# Patient Record
Sex: Male | Born: 1940 | Race: White | Hispanic: No | Marital: Married | Smoking: Former smoker
Health system: Southern US, Community
[De-identification: ages and names within clinical notes are randomized; demographics above are authoritative.]

## PROBLEM LIST (undated history)

## (undated) DIAGNOSIS — I10 Essential (primary) hypertension: Secondary | ICD-10-CM

## (undated) DIAGNOSIS — J449 Chronic obstructive pulmonary disease, unspecified: Secondary | ICD-10-CM

## (undated) DIAGNOSIS — C801 Malignant (primary) neoplasm, unspecified: Secondary | ICD-10-CM

## (undated) DIAGNOSIS — I509 Heart failure, unspecified: Secondary | ICD-10-CM

## (undated) DIAGNOSIS — I714 Abdominal aortic aneurysm, without rupture, unspecified: Secondary | ICD-10-CM

## (undated) DIAGNOSIS — C3490 Malignant neoplasm of unspecified part of unspecified bronchus or lung: Secondary | ICD-10-CM

## (undated) DIAGNOSIS — K219 Gastro-esophageal reflux disease without esophagitis: Secondary | ICD-10-CM

## (undated) DIAGNOSIS — Z951 Presence of aortocoronary bypass graft: Secondary | ICD-10-CM

## (undated) HISTORY — PX: CORONARY ARTERY BYPASS GRAFT: SHX141

## (undated) HISTORY — PX: CARDIAC DEFIBRILLATOR PLACEMENT: SHX171

---

## 2003-11-12 ENCOUNTER — Emergency Department: Payer: Self-pay | Admitting: Emergency Medicine

## 2005-03-01 ENCOUNTER — Other Ambulatory Visit: Payer: Self-pay

## 2005-03-02 ENCOUNTER — Inpatient Hospital Stay: Payer: Self-pay | Admitting: Internal Medicine

## 2005-03-03 ENCOUNTER — Other Ambulatory Visit: Payer: Self-pay

## 2005-03-04 ENCOUNTER — Other Ambulatory Visit: Payer: Self-pay

## 2008-07-16 ENCOUNTER — Encounter: Payer: Self-pay | Admitting: Internal Medicine

## 2008-07-16 ENCOUNTER — Inpatient Hospital Stay (HOSPITAL_COMMUNITY): Admission: EM | Admit: 2008-07-16 | Discharge: 2008-07-18 | Payer: Self-pay | Admitting: Internal Medicine

## 2008-07-16 ENCOUNTER — Ambulatory Visit: Payer: Self-pay | Admitting: Internal Medicine

## 2008-07-16 ENCOUNTER — Encounter: Payer: Self-pay | Admitting: Emergency Medicine

## 2008-07-17 ENCOUNTER — Encounter: Payer: Self-pay | Admitting: Cardiovascular Disease

## 2008-11-10 ENCOUNTER — Emergency Department: Payer: Self-pay | Admitting: Emergency Medicine

## 2010-05-17 LAB — BASIC METABOLIC PANEL
CO2: 26 mEq/L (ref 19–32)
CO2: 28 mEq/L (ref 19–32)
Calcium: 8.6 mg/dL (ref 8.4–10.5)
Chloride: 104 mEq/L (ref 96–112)
GFR calc Af Amer: 60 mL/min — ABNORMAL LOW (ref >60)
GFR calc Af Amer: >60 mL/min (ref >60)
GFR calc non Af Amer: 49 mL/min — ABNORMAL LOW (ref >60)
Glucose, Bld: 89 mg/dL (ref 70–99)
Sodium: 137 mEq/L (ref 135–145)
Sodium: 140 mEq/L (ref 135–145)

## 2010-05-17 LAB — CBC
HCT: 37.8 % — ABNORMAL LOW (ref 39.0–52.0)
Hemoglobin: 13.4 g/dL (ref 13.0–17.0)
MCHC: 33.8 g/dL (ref 30.0–36.0)
MCHC: 36.1 g/dL — ABNORMAL HIGH (ref 30.0–36.0)
MCV: 94.7 fL (ref 78.0–100.0)
MCV: 95.4 fL (ref 78.0–100.0)
Platelets: 103 10*3/uL — ABNORMAL LOW (ref 150–400)
Platelets: 108 10*3/uL — ABNORMAL LOW (ref 150–400)
RBC: 3.91 MIL/uL — ABNORMAL LOW (ref 4.22–5.81)
RBC: 4.2 MIL/uL — ABNORMAL LOW (ref 4.22–5.81)
RDW: 13.5 % (ref 11.5–15.5)
RDW: 13.6 % (ref 11.5–15.5)
WBC: 7.1 10*3/uL (ref 4.0–10.5)
WBC: 7.6 10*3/uL (ref 4.0–10.5)
WBC: 8 10*3/uL (ref 4.0–10.5)

## 2010-05-17 LAB — DIFFERENTIAL
Eosinophils Absolute: 0 10*3/uL (ref 0.0–0.7)
Eosinophils Relative: 1 % (ref 0–5)
Lymphs Abs: 2.7 10*3/uL (ref 0.7–4.0)

## 2010-05-17 LAB — POCT CARDIAC MARKERS
CKMB, poc: <1 ng/mL — ABNORMAL LOW (ref 1.0–8.0)
Myoglobin, poc: 97.4 ng/mL (ref 12–200)

## 2010-05-17 LAB — CARDIAC PANEL(CRET KIN+CKTOT+MB+TROPI)
CK, MB: 42.9 ng/mL — ABNORMAL HIGH (ref 0.3–4.0)
Relative Index: 12.1 — ABNORMAL HIGH (ref 0.0–2.5)
Relative Index: 9.7 — ABNORMAL HIGH (ref 0.0–2.5)
Total CK: 441 U/L — ABNORMAL HIGH (ref 7–232)
Troponin I: 1.95 ng/mL (ref 0.00–0.06)
Troponin I: 37.33 ng/mL (ref 0.00–0.06)
Troponin I: 40.93 ng/mL (ref 0.00–0.06)

## 2010-05-17 LAB — COMPREHENSIVE METABOLIC PANEL
ALT: 11 U/L (ref 0–53)
AST: 17 U/L (ref 0–37)
CO2: 26 mEq/L (ref 19–32)
Calcium: 9 mg/dL (ref 8.4–10.5)
Chloride: 103 mEq/L (ref 96–112)
GFR calc Af Amer: 51 mL/min — ABNORMAL LOW (ref >60)
GFR calc non Af Amer: 42 mL/min — ABNORMAL LOW (ref >60)
Potassium: 3.6 mEq/L (ref 3.5–5.1)
Sodium: 138 mEq/L (ref 135–145)

## 2010-05-17 LAB — HEMOGLOBIN A1C
Hgb A1c MFr Bld: 5.7 % (ref 4.6–6.1)
Mean Plasma Glucose: 117 mg/dL

## 2010-05-17 LAB — LIPID PANEL
HDL: 27 mg/dL — ABNORMAL LOW (ref >39)
Total CHOL/HDL Ratio: 4.7 RATIO
Triglycerides: 101 mg/dL (ref <150)

## 2010-05-17 LAB — GLUCOSE, CAPILLARY: Glucose-Capillary: 168 mg/dL — ABNORMAL HIGH (ref 70–99)

## 2010-05-17 LAB — PROTIME-INR: Prothrombin Time: 14 seconds (ref 11.6–15.2)

## 2010-06-22 NOTE — Discharge Summary (Signed)
[name], [name]                 ACCOUNT NO.:  [id number]   MEDICAL RECORD NO.:  [id number]          PATIENT TYPE:  INP   LOCATION:  2926                         FACILITY:  [facility]   PHYSICIAN:  Verne Carrow, MDDATE OF BIRTH:  09-01-1940   DATE OF ADMISSION:  07/16/2008  DATE OF DISCHARGE:  07/18/2008                               DISCHARGE SUMMARY   DISCHARGING DIAGNOSES:  1. Coronary artery disease/non-ST-elevated myocardial infarction      status post cardiac catheterization this admission.  The patient      with double-vessel coronary artery disease, global left ventricular      dysfunction with segmental wall motion abnormality, underwent      successful percutaneous coronary intervention with balloon      angioplasty only of distal obtuse marginal 1 lesion, will need      aspirin indefinitely and Plavix for at least 30 days but would like      to keep the patient on Plavix for a year if tolerated.  2. Ongoing tobacco abuse.  3. History of coronary artery disease status post coronary artery      bypass graft at [facility] approximately 15 years ago.  4. Hypertension.  5. Hyperlipidemia.  6. Chronic renal insufficiency.  7. Abnormal chest x-ray this admission.  The patient with 9-mm right      lower lobe pulmonary nodule, recommend short-term followup chest x-      ray in 2-3 months, no comparison studies to compare available.   HOSPITAL COURSE:  [name] is a 70 year old Caucasian gentleman with  known history of coronary artery disease status post CABG 15 years ago.  [name] receives his care through the [facility].  He  presented this admission complaining of chest discomfort with elevated  cardiac markers and hypertensive urgency.  The patient anticoagulated.  EKG showed T-wave inversions in inferior leads and flattening in lateral  leads.  Loaded with Plavix and continued nitroglycerin drip.  Proceed  with cardiac catheterization on July 17, 2008.  Chest  x-ray obtained  showed 9-mm right lower lobe pulmonary nodule, recommend followup x-ray  in 2-3 months.  The patient tolerated cardiac catheterization  intervention without complications.  A 2-D echocardiogram obtained,  technically difficult study, inferolateral and posterior basal  hypokinesis, mild mitral regurgitation.  Tobacco cessation education,  cardiac rehabilitation consults obtained.  The patient is ambulating in  the hall without difficulty, stable to be discharged home.  The patient  wishes to follow up with the [facility]  I have  given him copies of his cardiac catheterization and chest x-ray results  to follow up at the [place] as he is unable to recall the name of the doctor  that he sees.  He has been given the post cardiac catheterization  discharge instructions.  He agrees to call the [place] for followup  appointment in 1-2 weeks.  He also has our office number in case he has  any problems and needs to see one of [place].   MEDICATIONS AT THE TIME OF DISCHARGE:  1. Nitroglycerin as needed.  2. Atenolol 25  mg daily.  3. His omeprazole has been stopped.  4. He is instructed to use ranitidine, over the counter, instead.  5. Niacin 1000 mg daily.  6. Simvastatin 40.  7. Lisinopril 20.  8. Citalopram 40.  9. Isosorbide 120.  10.Digoxin 0.25.  11.Amlodipine 10 mg.  12.Plavix 75.  13.Aspirin 325 daily.  He has prescriptions for the Plavix, amlodipine which is new, and the  nitroglycerin.   DURATION OF DISCHARGE ENCOUNTER:  Well over 30 minutes.      Dorian Pod, ACNP      Verne Carrow, MD  Electronically Signed    MB/MEDQ  D:  07/18/2008  T:  07/19/2008  Job:  [id number]

## 2010-06-22 NOTE — H&P (Signed)
[name], [name]                 ACCOUNT NO.:  [id number]   MEDICAL RECORD NO.:  [id number]          PATIENT TYPE:  INP   LOCATION:  2908                         FACILITY:  [facility]   PHYSICIAN:  Hillis Range, MD       DATE OF BIRTH:  [birth year]   DATE OF ADMISSION:  07/16/2008  DATE OF DISCHARGE:                              HISTORY & PHYSICAL   CHIEF COMPLAINT:  Chest pain.   HISTORY OF PRESENT ILLNESS:  Mr. Grinder is a pleasant 70 year old  gentleman with a history of coronary artery disease status post CABG at  [facility] approximately 15 years ago, who now  presents on transfer from [facility] with chest pain.  The  patient reports being in his usual state of health until approximately 2  p.m. this afternoon, when he developed acute onset of chest discomfort  with radiation into both arms.  He reports associated nausea without  emesis.  He denies shortness of breath, palpitations, presyncope, or  syncope.  Upon arrival to [facility], the patient was noted to  have a blood pressure of 210/110.  He was initiated on nitroglycerin  drip, but continued to have chest discomfort.  He is therefore  transferred to [facility] for urgent management.  Presently, he  reports that his chest pain is 2/10, (previously 7/10).  He is also  noted to have frequent premature ventricular contractions.  The patient  is unable to describe his medicine regimen, but reports compliance with  medications.  He is a very active gentleman without any recent episodes  of angina or congestive heart failure.  He continues to smoke  approximately one pack per day.   PAST MEDICAL HISTORY:  1. Coronary artery disease status post 3-vessel CABG at [facility]      approximately 15 years ago.  2. Hypertension.  3. Hyperlipidemia.  4. Chronic renal insufficiency.   MEDICATIONS:  The patient is unable to recall.   ALLERGIES:  No known drug allergies.   REVIEW OF SYSTEMS:   All systems were reviewed and negative except as  outlined in the HPI above and as documented below.  The patient reports  occasional decrease in appetite.   SOCIAL HISTORY:  [place]  He  smokes 1 pack per day.  He denies alcohol or drug use.   FAMILY HISTORY:  Notable for coronary artery disease.   PHYSICAL EXAMINATION:  VITAL SIGNS:  Telemetry reveals sinus rhythm with  frequent premature ventricular contractions.  Blood pressure 178/116,  heart rate 63, respirations 16, sats 98% on 2 L, afebrile.  GENERAL:  The patient is a thin, well-appearing gentleman in no acute  distress.  He is alert and oriented x3.  HEENT: Normocephalic, atraumatic.  Sclerae clear.  Conjunctivae pink.  Oropharynx clear.  NECK:  Supple.  No thyromegaly, JVD, or bruits.  LUNGS:  Clear to auscultation bilaterally.  HEART:  Regular rate and rhythm with a very crisp S2, 2/6 systolic  ejection murmur along the left sternal border.  GI:  Soft, nontender, nondistended.  Positive bowel sounds.  EXTREMITIES:  No clubbing, cyanosis, or edema.  NEUROLOGIC:  Cranial nerves II-XII are intact.  Strength and sensation  are intact.  SKIN:  No ecchymoses or lacerations.  MUSCULOSKELETAL:  No deformity or atrophy.  PSYCH:  Euthymic mood, full affect.   EKG reveals sinus rhythm at 65 beats per minute with an inferior infarct  pattern as well as frequent premature ventricular contractions and ST  depression in the lateral precordial leads.   Labs from [facility] reveal a creatinine of 1.6, glucose 101,  potassium 3.6, INR 1.1.  White blood cell count 7.1, hematocrit 36,  platelets 108.  CK-MB less than 1, troponin less than 0.05.   Chest x-ray is pending.   IMPRESSION:  Mr. Cafiero is a pleasant 70 year old gentleman with coronary  artery disease, who is admitted on transfer from [facility] for  further evaluation and management of acute coronary syndrome and   hypertensive urgency.  The patient's chest pain appears to be improving  with a nitroglycerin drip.  He has EKG changes worrisome for ischemia,  but without ST elevation.  He has mild renal insufficiency and a prior  bypass graft.   PLAN:  At this point, I think that aggressive medical management is  necessary.  I have initiated the patient on Plavix, intravenous heparin,  and intravenous nitroglycerin.  We will attempt to control his blood  pressure and hopefully that will improve his angina.  If he continues to  have worsening chest pain overnight, then he will require urgent  catheterization.  Otherwise, we will plan to proceed with left heart  catheterization tomorrow morning after he has been adequately hydrated.  We will try to obtain records from [facility]  regarding his bypass anatomy.      Hillis Range, MD  Electronically Signed     JA/MEDQ  D:  07/16/2008  T:  07/17/2008  Job:  [id number]

## 2010-06-22 NOTE — Discharge Summary (Signed)
[name], [name]                 ACCOUNT NO.:  [id number]   MEDICAL RECORD NO.:  [id number]          PATIENT TYPE:  INP   LOCATION:  2926                         FACILITY[facility]  [facility]   PHYSICIAN:  Verne Carrow, MDDATE OF BIRTH:  11/10/1940   DATE OF ADMISSION:  07/16/2008  DATE OF DISCHARGE:  07/18/2008                               DISCHARGE SUMMARY   DISCHARGING DIAGNOSES:  1. Coronary artery disease/non-ST-elevated myocardial infarction.  2.   INCOMPLETE DICTATION.      Dorian Pod, ACNP      Verne Carrow, MD  Electronically Signed    MB/MEDQ  D:  07/18/2008  T:  07/18/2008  Job:  [id number]

## 2010-06-22 NOTE — Cardiovascular Report (Signed)
[name], [name]                 ACCOUNT NO.:  [id number]   MEDICAL RECORD NO.:  [id number]          PATIENT TYPE:  INP   LOCATION:  2926                         [facility]ACILITY[facility]  [facility]   PHYSICIAN:  [name], MDDATE OF BIRTH:  1940/05/17   DATE OF PROCEDURE:  07/17/2008  DATE OF DISCHARGE:                            CARDIAC CATHETERIZATION   PROCEDURES PERFORMED:  1. Left heart catheterization.  2. Selective coronary angiography.  3. Saphenous vein graft angiography.  4. Left ventricular angiogram.  5. Percutaneous coronary intervention with balloon angioplasty only of      a near-total occlusion of the distal segment of the first obtuse      marginal coronary artery.   OPERATOR:  Jared Carrow, MD   INDICATIONS:  Non-ST-elevation myocardial infarction in a 70 year old  patient with known coronary artery disease, who underwent three-vessel  CABG at [facility] in 1995, who has a history of  ongoing tobacco abuse, chronic renal insufficiency, hypertension, and  hyperlipidemia, and presented to [facility] yesterday with  complaints of ongoing chest pain.  The patient ruled in for a myocardial  infarction overnight and has had continued mild chest discomfort for the  last 72 hours.  He was treated with intravenous heparin, intravenous  nitroglycerin last night, and was loaded with Plavix last night as well.   PROCEDURE IN DETAIL:  The patient was brought to the main cardiac  catheterization laboratory after signing informed consent for the  procedure.  The right groin was prepped and draped in sterile fashion.  Lidocaine 1% was used for local anesthesia.  A 6-French sheath was  inserted into the right femoral artery without difficulty.  Coronary  angiography was performed.  A JL5 diagnostic catheter was used to  selectively engage the left main coronary artery.  A JR4 catheter was  used to selectively engage the native right coronary  artery as well as  the left internal mammary artery and the saphenous vein graft to the  PDA.  An LCB catheter was used to selectively engage the saphenous vein  graft to the diagonal and the obtuse marginal.  A pigtail catheter was  used to perform a left ventricular angiogram.   At this point in the case, we elected to proceed to intervention of the  near-total occlusion in the distal segment of the first obtuse marginal  coronary artery.  As noted above, the patient had been loaded with 600  mg of Plavix yesterday.  He was given a bolus of Angiomax and a drip was  started.  A Cougar intracoronary wire was passed through an XB 4.0  guiding catheter when the ACT was greater than 200.  A 2.0 x 8 mm  balloon was inflated twice for long inflations in the area of stenosis  in the distal vessel.  The balloon was taken to a maximum of 12  atmospheres, which gave [place] 2.19 mm.  I do not feel that the vessel was  large enough for a stent placement.  There was an optimal angiographic  result.  The stenosis prior to the intervention was  99% with TIMI 2  flow.  Following the intervention, the stenosis was 5% with TIMI 3 flow.  The patient tolerated the procedure well and was taken to the holding  area in stable condition.   HEMODYNAMIC FINDINGS:  Central aortic pressure 110/58.  Left ventricular  pressure 126/1.  Left ventricular end-diastolic pressure 11.   ANGIOGRAPHIC DATA:  1. The left main coronary artery had no evidence of obstructive      disease.  2. The left anterior descending is a large vessel that coursed to the      apex and just has minor disease.  There are serial 30% lesions in      the proximal midportion of the LAD.  First diagonal branch appears      to have a 40% ostial stenosis.  3. The circumflex artery is a large-caliber vessel that is comprised      mostly of a large obtuse marginal branch.  The first obtuse      marginal branch is a large-sized vessel that has a 99%  hazy-      appearing stenosis in the very distal portion where the vessel is      approximately 2.0 mm.  There is possible thrombus in this location.      The second obtuse marginal is a small to moderate-sized vessel that      fills late from left-to-left collaterals.  The AV groove circumflex      is a small to moderate-sized vessel.  4. The right coronary artery has a 100% proximal occlusion.  The      distal right coronary artery and PDA fills from left-to-right      collaterals.  This has been known since his last catheterization in      2007.  5. The saphenous vein graft to the second obtuse marginal is occluded.  6. The saphenous vein graft to the diagonal is occluded.  7. The saphenous vein graft to the posterior descending artery is      occluded.  8. Left internal mammary artery was selectively injected that does not      appear to have then used for bypass.  9. Left ventricular angiogram was performed in the RAO projection that      shows global hypokinesis with inferior wall akinesis.  Overall,      ejection fraction is 35-40%.  There is no evidence of mitral      regurgitation.   IMPRESSION:  1. Double-vessel coronary artery disease.  2. Global left ventricular systolic dysfunction with segmental wall      motion abnormalities.  3. Successful percutaneous coronary intervention with balloon      angioplasty only of the distal portion of the first obtuse marginal      branch.   RECOMMENDATIONS:  The patient should be continued on aspirin and Plavix  for at least 1 month the longer if he tolerates this medical therapy.  We will check an echocardiogram here in the hospital today to get  another assessment of his ejection fraction and look at his wall motion.  He will need to be started on an ACE inhibitor prior to discharge given  his left ventricular dysfunction, however, I will not start that today  with his renal insufficiency and recent catheterization.  We will  follow  up on a metabolic profile in the morning.  The patient has been found to  have a right upper lobe nodule on x-ray and will need follow up chest  x-  ray in 2-3 months.  For now, we will continue his beta-blocker and  statin therapy.  He is also encouraged to stop smoking.      Jared Carrow, MD  Electronically Signed     CM/MEDQ  D:  07/17/2008  T:  07/17/2008  Job:  [id number]

## 2011-02-23 ENCOUNTER — Emergency Department: Payer: Self-pay | Admitting: Unknown Physician Specialty

## 2011-02-23 LAB — URINALYSIS, COMPLETE
Bilirubin,UR: NEGATIVE
Leukocyte Esterase: NEGATIVE
Nitrite: NEGATIVE
Ph: 5 (ref 4.5–8.0)
Squamous Epithelial: NONE SEEN

## 2013-02-27 ENCOUNTER — Emergency Department: Payer: Self-pay | Admitting: Emergency Medicine

## 2013-02-27 LAB — PROTIME-INR
INR: 1.2
PROTHROMBIN TIME: 15.3 s — AB (ref 11.5–14.7)

## 2013-02-27 LAB — COMPREHENSIVE METABOLIC PANEL
ALBUMIN: 3.9 g/dL (ref 3.4–5.0)
ANION GAP: 7 (ref 7–16)
Alkaline Phosphatase: 248 U/L — ABNORMAL HIGH
BILIRUBIN TOTAL: 1.2 mg/dL — AB (ref 0.2–1.0)
BUN: 15 mg/dL (ref 7–18)
CALCIUM: 8.5 mg/dL (ref 8.5–10.1)
CHLORIDE: 103 mmol/L (ref 98–107)
CO2: 29 mmol/L (ref 21–32)
Creatinine: 1.79 mg/dL — ABNORMAL HIGH (ref 0.60–1.30)
GFR CALC AF AMER: 43 — AB
GFR CALC NON AF AMER: 37 — AB
Glucose: 113 mg/dL — ABNORMAL HIGH (ref 65–99)
Osmolality: 279 (ref 275–301)
Potassium: 3.4 mmol/L — ABNORMAL LOW (ref 3.5–5.1)
SGOT(AST): 53 U/L — ABNORMAL HIGH (ref 15–37)
SGPT (ALT): 55 U/L (ref 12–78)
SODIUM: 139 mmol/L (ref 136–145)
Total Protein: 7.5 g/dL (ref 6.4–8.2)

## 2013-02-27 LAB — URINALYSIS, COMPLETE
Bacteria: NONE SEEN
Bilirubin,UR: NEGATIVE
GLUCOSE, UR: NEGATIVE mg/dL (ref 0–75)
KETONE: NEGATIVE
Leukocyte Esterase: NEGATIVE
Nitrite: NEGATIVE
PH: 5 (ref 4.5–8.0)
PROTEIN: NEGATIVE
RBC,UR: 17 /HPF (ref 0–5)
Specific Gravity: 1.021 (ref 1.003–1.030)
Squamous Epithelial: <1
WBC UR: 10 /HPF (ref 0–5)

## 2013-02-27 LAB — MAGNESIUM: Magnesium: 1.7 mg/dL — ABNORMAL LOW

## 2013-02-27 LAB — CBC WITH DIFFERENTIAL/PLATELET
BASOS PCT: 0.5 %
Basophil #: 0 10*3/uL (ref 0.0–0.1)
Eosinophil #: 0 10*3/uL (ref 0.0–0.7)
Eosinophil %: 0.4 %
HCT: 41.9 % (ref 40.0–52.0)
HGB: 14.1 g/dL (ref 13.0–18.0)
LYMPHS PCT: 16.2 %
Lymphocyte #: 1.1 10*3/uL (ref 1.0–3.6)
MCH: 31.5 pg (ref 26.0–34.0)
MCHC: 33.6 g/dL (ref 32.0–36.0)
MCV: 94 fL (ref 80–100)
MONOS PCT: 6.4 %
Monocyte #: 0.4 x10 3/mm (ref 0.2–1.0)
NEUTROS ABS: 5.1 10*3/uL (ref 1.4–6.5)
Neutrophil %: 76.5 %
PLATELETS: 114 10*3/uL — AB (ref 150–440)
RBC: 4.47 10*6/uL (ref 4.40–5.90)
RDW: 13.6 % (ref 11.5–14.5)
WBC: 6.7 10*3/uL (ref 3.8–10.6)

## 2013-02-27 LAB — RAPID INFLUENZA A&B ANTIGENS (ARMC ONLY)

## 2013-02-27 LAB — TROPONIN I

## 2013-02-27 LAB — PHOSPHORUS: PHOSPHORUS: 2.6 mg/dL (ref 2.5–4.9)

## 2013-02-28 LAB — URINE CULTURE

## 2017-11-10 ENCOUNTER — Ambulatory Visit
Admission: RE | Admit: 2017-11-10 | Discharge: 2017-11-10 | Disposition: A | Discharge status: Home / Self Care | Payer: No Typology Code available for payment source | Source: Ambulatory Visit | Attending: Radiation Oncology | Admitting: Radiation Oncology

## 2017-11-10 ENCOUNTER — Other Ambulatory Visit: Payer: Self-pay

## 2017-11-10 ENCOUNTER — Encounter (INDEPENDENT_AMBULATORY_CARE_PROVIDER_SITE_OTHER): Payer: Self-pay

## 2017-11-10 VITALS — BP 135/79 | HR 74 | Temp 97.1°F | Resp 16 | Ht 72.0 in

## 2017-11-10 DIAGNOSIS — C3431 Malignant neoplasm of lower lobe, right bronchus or lung: Secondary | ICD-10-CM | POA: Insufficient documentation

## 2017-11-10 DIAGNOSIS — Z79899 Other long term (current) drug therapy: Secondary | ICD-10-CM | POA: Insufficient documentation

## 2017-11-10 DIAGNOSIS — Z7901 Long term (current) use of anticoagulants: Secondary | ICD-10-CM | POA: Insufficient documentation

## 2017-11-10 NOTE — Consult Note (Signed)
NEW PATIENT EVALUATION  Name: [name]  MRN: [id number]  Date:   11/10/2017     DOB: 1940/03/04   This 77 y.o. 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  REFERRING PHYSICIAN: Burnell Blanks*  CHIEF COMPLAINT:  Chief Complaint  Patient presents with  . Lung Cancer    DIAGNOSIS: The encounter diagnosis was Malignant neoplasm of lower lobe of right lung ([place]).   PREVIOUS INVESTIGATIONS:  PET CT and CT scans requested for my review Pathology report reviewed Clinical notes reviewed  HPI: patient is a 76 year old male seen from the [place] hospital. He's been followed for an increasing mass in his right lower lobe which increased over time. Eventually in August 2019 had a CT-guided biopsy which was conclusive for adenocarcinoma. His PFTs have an FEV1 of 58% and DLCO of 46% even seen by thoracic oncology. The tumor on the most recent CT scan was measured at 1.1 x 1.8 cm. Patient and family have declined surgical intervention he did meet with radiation oncology who recommended SB RT although other facility cannot provide that and he is referred here for treatment. Patient is doing well. Has mild cough somethick phlegm in the morning no hemoptysis loss of weight or dysphagia.  PLANNED TREATMENT REGIMEN: SB RT  PAST MEDICAL HISTORY:  has no past medical history on file.    PAST SURGICAL HISTORY:   FAMILY HISTORY: family history is not on file.  SOCIAL HISTORY:    ALLERGIES: Dofetilide  MEDICATIONS:  Current Outpatient Medications  Medication Sig Dispense Refill  . atorvastatin (LIPITOR) 40 MG tablet Take 0.5 tablets by mouth daily.    . finasteride (PROSCAR) 5 MG tablet Take 1 tablet by mouth daily.    . metoprolol succinate (TOPROL-XL) 25 MG 24 hr tablet Take 0.5 tablets by mouth daily.    Marland Kitchen mexiletine (MEXITIL) 150 MG capsule Take 1 capsule by mouth daily.    . midodrine (PROAMATINE) 2.5 MG tablet Take 1  tablet by mouth 3 (three) times daily.    Marland Kitchen omeprazole (PRILOSEC) 20 MG capsule Take 1 capsule by mouth daily.    . Rivaroxaban (XARELTO) 15 MG TABS tablet Take 15 mg by mouth 2 (two) times daily with a meal.    . tamsulosin (FLOMAX) 0.4 MG CAPS capsule Take 1 capsule by mouth daily.    . Tiotropium Bromide-Olodaterol (STIOLTO RESPIMAT) 2.5-2.5 MCG/ACT AERS Inhale 2 puffs into the lungs daily.     No current facility-administered medications for this encounter.     ECOG PERFORMANCE STATUS:  0 - Asymptomatic  REVIEW OF SYSTEMS:  Patient denies any weight loss, fatigue, weakness, fever, chills or night sweats. Patient denies any loss of vision, blurred vision. Patient denies any ringing  of the ears or hearing loss. No irregular heartbeat. Patient denies heart murmur or history of fainting. Patient denies any chest pain or pain radiating to her upper extremities. Patient denies any shortness of breath, difficulty breathing at night, cough or hemoptysis. Patient denies any swelling in the lower legs. Patient denies any nausea vomiting, vomiting of blood, or coffee ground material in the vomitus. Patient denies any stomach pain. Patient states has had normal bowel movements no significant constipation or diarrhea. Patient denies any dysuria, hematuria or significant nocturia. Patient denies any problems walking, swelling in the joints or loss of balance. Patient denies any skin changes, loss of hair or loss of weight. Patient denies any excessive worrying or  anxiety or significant depression. Patient denies any problems with insomnia. Patient denies excessive thirst, polyuria, polydipsia. Patient denies any swollen glands, patient denies easy bruising or easy bleeding. Patient denies any recent infections, allergies or URI. Patient "s visual fields have not changed significantly in recent time.    PHYSICAL EXAM: BP 135/79 Comment: recheck  Pulse 74   Temp (!) 97.1 F (36.2 C) (Tympanic)   Resp 16    Ht 6' (1.829 m)  Well-developed well-nourished patient in NAD. HEENT reveals PERLA, EOMI, discs not visualized.  Oral cavity is clear. No oral mucosal lesions are identified. Neck is clear without evidence of cervical or supraclavicular adenopathy. Lungs are clear to A&P. Cardiac examination is essentially unremarkable with regular rate and rhythm without murmur rub or thrill. Abdomen is benign with no organomegaly or masses noted. Motor sensory and DTR levels are equal and symmetric in the upper and lower extremities. Cranial nerves II through XII are grossly intact. Proprioception is intact. No peripheral adenopathy or edema is identified. No motor or sensory levels are noted. Crude visual fields are within normal range.  LABORATORY DATA: pathology report reviewed    RADIOLOGY RESULTS:CT scan PET CT scan requested for my review   IMPRESSION: stage I adenocarcinoma the right lower lobe in 77 year old male for SB RT  PLAN: at the present time I recommend SB RT to his right lower lobe. I will review his PET/CT and CT films when they arrive. I will plan on delivering 6000 cGy in 5 fractions. Risks and benefits of treatment including possible development of cough fatigue possible radiation esophagitis and skin reaction all were described in detail to the patient. He seems to comprehend my treatment plan well. I have personally 7 ordered CT simulation for next week. Will use motion restriction as well as 4-dimensional treatment planning. Patient family compromise treatment plan well.  I would like to take this opportunity to thank you for allowing me to participate in the care of your patient.Noreene Filbert, MD

## 2017-11-16 ENCOUNTER — Ambulatory Visit
Admission: RE | Admit: 2017-11-16 | Discharge: 2017-11-16 | Disposition: A | Discharge status: Home / Self Care | Payer: No Typology Code available for payment source | Source: Ambulatory Visit | Attending: Radiation Oncology | Admitting: Radiation Oncology

## 2017-11-16 DIAGNOSIS — C3431 Malignant neoplasm of lower lobe, right bronchus or lung: Secondary | ICD-10-CM | POA: Insufficient documentation

## 2017-11-16 DIAGNOSIS — Z510 Encounter for antineoplastic radiation therapy: Secondary | ICD-10-CM | POA: Diagnosis not present

## 2017-11-17 DIAGNOSIS — Z510 Encounter for antineoplastic radiation therapy: Secondary | ICD-10-CM | POA: Diagnosis not present

## 2017-11-27 ENCOUNTER — Ambulatory Visit
Admission: RE | Admit: 2017-11-27 | Discharge: 2017-11-27 | Disposition: A | Discharge status: Home / Self Care | Payer: No Typology Code available for payment source | Source: Ambulatory Visit | Attending: Radiation Oncology | Admitting: Radiation Oncology

## 2017-11-27 DIAGNOSIS — Z510 Encounter for antineoplastic radiation therapy: Secondary | ICD-10-CM | POA: Diagnosis not present

## 2017-11-29 ENCOUNTER — Ambulatory Visit
Admission: RE | Admit: 2017-11-29 | Discharge: 2017-11-29 | Disposition: A | Discharge status: Home / Self Care | Payer: No Typology Code available for payment source | Source: Ambulatory Visit | Attending: Radiation Oncology | Admitting: Radiation Oncology

## 2017-11-29 DIAGNOSIS — Z510 Encounter for antineoplastic radiation therapy: Secondary | ICD-10-CM | POA: Diagnosis not present

## 2017-12-04 ENCOUNTER — Ambulatory Visit
Admission: RE | Admit: 2017-12-04 | Discharge: 2017-12-04 | Disposition: A | Discharge status: Home / Self Care | Payer: No Typology Code available for payment source | Source: Ambulatory Visit | Attending: Radiation Oncology | Admitting: Radiation Oncology

## 2017-12-04 DIAGNOSIS — Z510 Encounter for antineoplastic radiation therapy: Secondary | ICD-10-CM | POA: Diagnosis not present

## 2017-12-06 ENCOUNTER — Ambulatory Visit
Admission: RE | Admit: 2017-12-06 | Discharge: 2017-12-06 | Disposition: A | Discharge status: Home / Self Care | Payer: No Typology Code available for payment source | Source: Ambulatory Visit | Attending: Radiation Oncology | Admitting: Radiation Oncology

## 2017-12-06 DIAGNOSIS — Z510 Encounter for antineoplastic radiation therapy: Secondary | ICD-10-CM | POA: Diagnosis not present

## 2017-12-09 ENCOUNTER — Other Ambulatory Visit: Payer: Self-pay

## 2017-12-09 ENCOUNTER — Emergency Department: Payer: No Typology Code available for payment source

## 2017-12-09 ENCOUNTER — Inpatient Hospital Stay
Admission: EM | Admit: 2017-12-09 | Discharge: 2017-12-14 | DRG: 308 | Disposition: A | Discharge status: Other Acute Inpatient Hospital | Payer: No Typology Code available for payment source | Attending: Internal Medicine | Admitting: Internal Medicine

## 2017-12-09 DIAGNOSIS — R7401 Elevation of levels of liver transaminase levels: Secondary | ICD-10-CM

## 2017-12-09 DIAGNOSIS — I255 Ischemic cardiomyopathy: Secondary | ICD-10-CM | POA: Diagnosis not present

## 2017-12-09 DIAGNOSIS — Z4502 Encounter for adjustment and management of automatic implantable cardiac defibrillator: Secondary | ICD-10-CM

## 2017-12-09 DIAGNOSIS — K8021 Calculus of gallbladder without cholecystitis with obstruction: Secondary | ICD-10-CM

## 2017-12-09 DIAGNOSIS — N184 Chronic kidney disease, stage 4 (severe): Secondary | ICD-10-CM | POA: Diagnosis present

## 2017-12-09 DIAGNOSIS — R112 Nausea with vomiting, unspecified: Secondary | ICD-10-CM | POA: Diagnosis not present

## 2017-12-09 DIAGNOSIS — Z87891 Personal history of nicotine dependence: Secondary | ICD-10-CM

## 2017-12-09 DIAGNOSIS — R58 Hemorrhage, not elsewhere classified: Secondary | ICD-10-CM | POA: Diagnosis not present

## 2017-12-09 DIAGNOSIS — C3490 Malignant neoplasm of unspecified part of unspecified bronchus or lung: Secondary | ICD-10-CM | POA: Diagnosis present

## 2017-12-09 DIAGNOSIS — K661 Hemoperitoneum: Secondary | ICD-10-CM | POA: Diagnosis present

## 2017-12-09 DIAGNOSIS — R002 Palpitations: Secondary | ICD-10-CM | POA: Diagnosis present

## 2017-12-09 DIAGNOSIS — I951 Orthostatic hypotension: Secondary | ICD-10-CM | POA: Diagnosis present

## 2017-12-09 DIAGNOSIS — I130 Hypertensive heart and chronic kidney disease with heart failure and stage 1 through stage 4 chronic kidney disease, or unspecified chronic kidney disease: Secondary | ICD-10-CM | POA: Diagnosis present

## 2017-12-09 DIAGNOSIS — R55 Syncope and collapse: Secondary | ICD-10-CM | POA: Diagnosis not present

## 2017-12-09 DIAGNOSIS — I5042 Chronic combined systolic (congestive) and diastolic (congestive) heart failure: Secondary | ICD-10-CM | POA: Diagnosis not present

## 2017-12-09 DIAGNOSIS — I2510 Atherosclerotic heart disease of native coronary artery without angina pectoris: Secondary | ICD-10-CM | POA: Diagnosis present

## 2017-12-09 DIAGNOSIS — I472 Ventricular tachycardia, unspecified: Secondary | ICD-10-CM

## 2017-12-09 DIAGNOSIS — I4892 Unspecified atrial flutter: Secondary | ICD-10-CM | POA: Diagnosis present

## 2017-12-09 DIAGNOSIS — I340 Nonrheumatic mitral (valve) insufficiency: Secondary | ICD-10-CM | POA: Diagnosis not present

## 2017-12-09 DIAGNOSIS — I4821 Permanent atrial fibrillation: Secondary | ICD-10-CM | POA: Diagnosis not present

## 2017-12-09 DIAGNOSIS — R52 Pain, unspecified: Secondary | ICD-10-CM | POA: Diagnosis not present

## 2017-12-09 DIAGNOSIS — Z825 Family history of asthma and other chronic lower respiratory diseases: Secondary | ICD-10-CM

## 2017-12-09 DIAGNOSIS — I5022 Chronic systolic (congestive) heart failure: Secondary | ICD-10-CM | POA: Diagnosis not present

## 2017-12-09 DIAGNOSIS — Z95810 Presence of automatic (implantable) cardiac defibrillator: Secondary | ICD-10-CM

## 2017-12-09 DIAGNOSIS — K219 Gastro-esophageal reflux disease without esophagitis: Secondary | ICD-10-CM | POA: Diagnosis present

## 2017-12-09 DIAGNOSIS — Z951 Presence of aortocoronary bypass graft: Secondary | ICD-10-CM

## 2017-12-09 DIAGNOSIS — R011 Cardiac murmur, unspecified: Secondary | ICD-10-CM | POA: Diagnosis present

## 2017-12-09 DIAGNOSIS — I739 Peripheral vascular disease, unspecified: Secondary | ICD-10-CM | POA: Diagnosis present

## 2017-12-09 DIAGNOSIS — E860 Dehydration: Secondary | ICD-10-CM | POA: Diagnosis present

## 2017-12-09 DIAGNOSIS — R579 Shock, unspecified: Secondary | ICD-10-CM | POA: Diagnosis present

## 2017-12-09 DIAGNOSIS — J432 Centrilobular emphysema: Secondary | ICD-10-CM | POA: Diagnosis not present

## 2017-12-09 DIAGNOSIS — I959 Hypotension, unspecified: Secondary | ICD-10-CM | POA: Diagnosis not present

## 2017-12-09 DIAGNOSIS — R740 Nonspecific elevation of levels of transaminase and lactic acid dehydrogenase [LDH]: Secondary | ICD-10-CM

## 2017-12-09 DIAGNOSIS — Z79899 Other long term (current) drug therapy: Secondary | ICD-10-CM

## 2017-12-09 DIAGNOSIS — Z955 Presence of coronary angioplasty implant and graft: Secondary | ICD-10-CM

## 2017-12-09 DIAGNOSIS — N179 Acute kidney failure, unspecified: Secondary | ICD-10-CM | POA: Diagnosis present

## 2017-12-09 DIAGNOSIS — E876 Hypokalemia: Secondary | ICD-10-CM | POA: Diagnosis present

## 2017-12-09 DIAGNOSIS — R109 Unspecified abdominal pain: Secondary | ICD-10-CM

## 2017-12-09 DIAGNOSIS — Z8679 Personal history of other diseases of the circulatory system: Secondary | ICD-10-CM

## 2017-12-09 DIAGNOSIS — I4901 Ventricular fibrillation: Principal | ICD-10-CM | POA: Diagnosis present

## 2017-12-09 DIAGNOSIS — I447 Left bundle-branch block, unspecified: Secondary | ICD-10-CM | POA: Diagnosis present

## 2017-12-09 DIAGNOSIS — Z8249 Family history of ischemic heart disease and other diseases of the circulatory system: Secondary | ICD-10-CM

## 2017-12-09 DIAGNOSIS — K8070 Calculus of gallbladder and bile duct without cholecystitis without obstruction: Secondary | ICD-10-CM | POA: Diagnosis present

## 2017-12-09 DIAGNOSIS — I351 Nonrheumatic aortic (valve) insufficiency: Secondary | ICD-10-CM | POA: Diagnosis not present

## 2017-12-09 DIAGNOSIS — Z888 Allergy status to other drugs, medicaments and biological substances status: Secondary | ICD-10-CM

## 2017-12-09 DIAGNOSIS — I10 Essential (primary) hypertension: Secondary | ICD-10-CM | POA: Diagnosis present

## 2017-12-09 DIAGNOSIS — J449 Chronic obstructive pulmonary disease, unspecified: Secondary | ICD-10-CM | POA: Diagnosis present

## 2017-12-09 DIAGNOSIS — Z7901 Long term (current) use of anticoagulants: Secondary | ICD-10-CM

## 2017-12-09 DIAGNOSIS — I25110 Atherosclerotic heart disease of native coronary artery with unstable angina pectoris: Secondary | ICD-10-CM | POA: Diagnosis not present

## 2017-12-09 HISTORY — DX: Heart failure, unspecified: I50.9

## 2017-12-09 HISTORY — DX: Malignant (primary) neoplasm, unspecified: C80.1

## 2017-12-09 HISTORY — DX: Abdominal aortic aneurysm, without rupture, unspecified: I71.40

## 2017-12-09 HISTORY — DX: Gastro-esophageal reflux disease without esophagitis: K21.9

## 2017-12-09 HISTORY — DX: Abdominal aortic aneurysm, without rupture: I71.4

## 2017-12-09 HISTORY — DX: Chronic obstructive pulmonary disease, unspecified: J44.9

## 2017-12-09 HISTORY — DX: Presence of aortocoronary bypass graft: Z95.1

## 2017-12-09 HISTORY — DX: Essential (primary) hypertension: I10

## 2017-12-09 HISTORY — DX: Malignant neoplasm of unspecified part of unspecified bronchus or lung: C34.90

## 2017-12-09 LAB — CBC WITH DIFFERENTIAL/PLATELET
Abs Immature Granulocytes: 0.01 10*3/uL (ref 0.00–0.07)
BASOS ABS: 0 10*3/uL (ref 0.0–0.1)
Basophils Relative: 0 %
Eosinophils Absolute: 0 10*3/uL (ref 0.0–0.5)
Eosinophils Relative: 0 %
HEMATOCRIT: 34.4 % — AB (ref 39.0–52.0)
Hemoglobin: 10.8 g/dL — ABNORMAL LOW (ref 13.0–17.0)
IMMATURE GRANULOCYTES: 0 %
LYMPHS PCT: 9 %
Lymphs Abs: 0.2 10*3/uL — ABNORMAL LOW (ref 0.7–4.0)
MCH: 30.3 pg (ref 26.0–34.0)
MCHC: 31.4 g/dL (ref 30.0–36.0)
MCV: 96.4 fL (ref 80.0–100.0)
MONO ABS: 0.1 10*3/uL (ref 0.1–1.0)
MONOS PCT: 2 %
NEUTROS ABS: 2.2 10*3/uL (ref 1.7–7.7)
Neutrophils Relative %: 89 %
PLATELETS: 116 10*3/uL — AB (ref 150–400)
RBC: 3.57 MIL/uL — AB (ref 4.22–5.81)
RDW: 14.4 % (ref 11.5–15.5)
WBC: 2.5 10*3/uL — AB (ref 4.0–10.5)
nRBC: 0 % (ref 0.0–0.2)

## 2017-12-09 LAB — TROPONIN I: Troponin I: 0.03 ng/mL (ref <0.03)

## 2017-12-09 LAB — COMPREHENSIVE METABOLIC PANEL
ALBUMIN: 3.7 g/dL (ref 3.5–5.0)
ALT: 70 U/L — ABNORMAL HIGH (ref 0–44)
ANION GAP: 10 (ref 5–15)
AST: 227 U/L — ABNORMAL HIGH (ref 15–41)
Alkaline Phosphatase: 373 U/L — ABNORMAL HIGH (ref 38–126)
BILIRUBIN TOTAL: 3.4 mg/dL — AB (ref 0.3–1.2)
BUN: 22 mg/dL (ref 8–23)
CHLORIDE: 105 mmol/L (ref 98–111)
CO2: 27 mmol/L (ref 22–32)
Calcium: 8.8 mg/dL — ABNORMAL LOW (ref 8.9–10.3)
Creatinine, Ser: 2.4 mg/dL — ABNORMAL HIGH (ref 0.61–1.24)
GFR calc Af Amer: 29 mL/min — ABNORMAL LOW (ref >60)
GFR, EST NON AFRICAN AMERICAN: 25 mL/min — AB (ref >60)
Glucose, Bld: 174 mg/dL — ABNORMAL HIGH (ref 70–99)
Potassium: 3.5 mmol/L (ref 3.5–5.1)
Sodium: 142 mmol/L (ref 135–145)
TOTAL PROTEIN: 6.8 g/dL (ref 6.5–8.1)

## 2017-12-09 LAB — LIPASE, BLOOD: LIPASE: 26 U/L (ref 11–51)

## 2017-12-09 LAB — MAGNESIUM: MAGNESIUM: 1.3 mg/dL — AB (ref 1.7–2.4)

## 2017-12-09 MED ORDER — ONDANSETRON HCL 4 MG/2ML IJ SOLN
INTRAMUSCULAR | Status: AC
  Administered 2017-12-09: 4 mg via INTRAVENOUS
  Filled 2017-12-09: qty 2

## 2017-12-09 MED ORDER — AMIODARONE IV BOLUS ONLY 150 MG/100ML
150.0000 mg | Freq: Once | INTRAVENOUS | Status: AC
  Administered 2017-12-09: 150 mg via INTRAVENOUS
  Filled 2017-12-09: qty 100

## 2017-12-09 MED ORDER — PIPERACILLIN-TAZOBACTAM 3.375 G IVPB 30 MIN
3.3750 g | Freq: Once | INTRAVENOUS | Status: AC
  Administered 2017-12-09: 3.375 g via INTRAVENOUS
  Filled 2017-12-09: qty 50

## 2017-12-09 MED ORDER — AMIODARONE HCL IN DEXTROSE 360-4.14 MG/200ML-% IV SOLN
60.0000 mg/h | INTRAVENOUS | Status: DC
  Filled 2017-12-09: qty 200

## 2017-12-09 MED ORDER — ONDANSETRON HCL 4 MG/2ML IJ SOLN
4.0000 mg | Freq: Once | INTRAMUSCULAR | Status: AC
  Administered 2017-12-09: 4 mg via INTRAVENOUS

## 2017-12-09 MED ORDER — HEPARIN (PORCINE) IN NACL 100-0.45 UNIT/ML-% IJ SOLN
1200.0000 [IU]/h | INTRAMUSCULAR | Status: DC
  Administered 2017-12-10: 1200 [IU]/h via INTRAVENOUS

## 2017-12-09 MED ORDER — MAGNESIUM SULFATE 2 GM/50ML IV SOLN
2.0000 g | Freq: Once | INTRAVENOUS | Status: AC
  Administered 2017-12-09: 2 g via INTRAVENOUS
  Filled 2017-12-09: qty 50

## 2017-12-09 MED ORDER — HEPARIN BOLUS VIA INFUSION
4700.0000 [IU] | Freq: Once | INTRAVENOUS | Status: AC
  Administered 2017-12-10: 4700 [IU] via INTRAVENOUS
  Filled 2017-12-09: qty 4700

## 2017-12-09 MED ORDER — AMIODARONE HCL IN DEXTROSE 360-4.14 MG/200ML-% IV SOLN: 30.0000 mg/h | INTRAVENOUS | Status: DC

## 2017-12-09 NOTE — ED Triage Notes (Signed)
Pt arrived via ems from home with N/V/abdominal pain that started x1 hour ago. Pt just finished CA tx on Wed 2 rounds of radiation and 2 rounds 2 weeks before. Pt hx: 5 MI's, bypass surgery, AAA, CHF, COPD, and afib. Pt defibrillator shocked once upon arrival.

## 2017-12-09 NOTE — H&P (Signed)
[facility] NAME: [name]    MR#:  [id number]  DATE OF BIRTH:  1940/07/14  DATE OF ADMISSION:  12/09/2017  PRIMARY CARE PHYSICIAN: Burnell Blanks, MD   REQUESTING/REFERRING PHYSICIAN: Alfred Levins, MD  CHIEF COMPLAINT:   Chief Complaint  Patient presents with  . Nausea    HISTORY OF PRESENT ILLNESS:  [name]  is a 77 y.o. male who presents with chief complaint as above.  Patient with a significant cardiac history post CABG, with CHF post AICD and a AAA presented with nausea, vomiting and weakness.  Upon arrival at the ED the patient went into ventricular fibrillation and was shocked by his AICD converting him back to Atrial fibrillation which is the patient's reported chronic rhythm.  Patient reported becoming nauseous and vomiting 3-4 times this afternoon 2-3 hours after eating a cheese dog.  Patient also reports feeling a "fire" across his lower abdomen, with chills, fatigue and increased thirst.  This is the third episode like this the patient reports having, in previous episodes he was unable to tolerate any type of food for 1-2 days before it went away.  Patient denies fever/chest pain/diaphoresis.  Patient denies blood in his emesis, but reports having a "darker than usual" stool today.  Patient reports having increased dyspnea overnight and in the early morning, patient used his home inhalers with limited success.  Patient is currently being treated for lung cancer with radiation treatment, most recent course completed 1 week ago.  Abdominal US positive for sludge and a mildly dilated common bile duct in gallbladder with multiple stones, as well as a 2.9 cm mass on the left hepatic lobe.  Liver enzymes and Cr. are markedly elevated from baseline. Hospitalist consulted for admission.  PAST MEDICAL HISTORY:   Past Medical History:  Diagnosis Date  . AAA (abdominal aortic aneurysm) ([place])   . Cancer ([place])   . CHF  (congestive heart failure) ([place])   . COPD (chronic obstructive pulmonary disease) ([place])   . GERD (gastroesophageal reflux disease)   . HTN (hypertension)   . Hx of CABG   . Lung cancer ([place])      PAST SURGICAL HISTORY:   Past Surgical History:  Procedure Laterality Date  . CARDIAC DEFIBRILLATOR PLACEMENT    . CORONARY ARTERY BYPASS GRAFT       SOCIAL HISTORY:   Social History   Tobacco Use  . Smoking status: Former Smoker    Years: 15.00    Types: Cigarettes  Substance Use Topics  . Alcohol use: Not Currently     FAMILY HISTORY:   Family History  Problem Relation Age of Onset  . Heart disease Mother   . Diabetes Father   . COPD Sister   . COPD Brother      DRUG ALLERGIES:   Allergies  Allergen Reactions  . Dofetilide Other (See Comments)    Syncope    MEDICATIONS AT HOME:   Prior to Admission medications   Medication Sig Start Date End Date Taking? Authorizing Provider  atorvastatin (LIPITOR) 40 MG tablet Take 0.5 tablets by mouth daily. 03/19/12   [provider]  finasteride (PROSCAR) 5 MG tablet Take 1 tablet by mouth daily. 10/06/10   [provider]  metoprolol succinate (TOPROL-XL) 25 MG 24 hr tablet Take 0.5 tablets by mouth daily. 06/05/17 06/05/18  [provider]  mexiletine (MEXITIL) 150 MG capsule Take 1 capsule by mouth daily.    [provider]  midodrine (PROAMATINE) 2.5 MG tablet Take 1 tablet by mouth 3 (three) times daily.    [provider]  omeprazole (PRILOSEC) 20 MG capsule Take 1 capsule by mouth daily. 03/30/11   [provider]  Rivaroxaban (XARELTO) 15 MG TABS tablet Take 15 mg by mouth 2 (two) times daily with a meal.    [provider]  tamsulosin (FLOMAX) 0.4 MG CAPS capsule Take 1 capsule by mouth daily. 10/06/10   [provider]  Tiotropium Bromide-Olodaterol (STIOLTO RESPIMAT) 2.5-2.5 MCG/ACT AERS Inhale 2 puffs into the lungs daily.    [provider]    REVIEW OF SYSTEMS:  Review of Systems  Constitutional: Positive for malaise/fatigue. Negative for chills, diaphoresis, fever and weight loss.  HENT: Negative for ear pain, hearing loss and tinnitus.   Eyes: Negative for blurred vision, double vision, pain and redness.  Respiratory: Positive for cough and shortness of breath. Negative for hemoptysis and sputum production.   Cardiovascular: Positive for palpitations. Negative for chest pain, orthopnea and leg swelling.  Gastrointestinal: Positive for abdominal pain, nausea and vomiting. Negative for constipation and diarrhea.  Genitourinary: Negative for dysuria, frequency and hematuria.  Musculoskeletal: Negative for back pain, joint pain and neck pain.  Skin:       No acne, rash, or lesions  Neurological: Positive for weakness. Negative for dizziness, tremors and focal weakness.  Endo/Heme/Allergies: Negative for polydipsia. Does not bruise/bleed easily.  Psychiatric/Behavioral: Negative for depression. The patient is not nervous/anxious and does not have insomnia.      VITAL SIGNS:   Vitals:   12/09/17 1900 12/09/17 1930 12/09/17 2000 12/09/17 2010  BP: 133/82 130/66 122/80 122/80  Pulse: (!) 108 (!) 57 90 (!) 101  Resp: (!) 32 (!) 25 (!) 27 17  Temp:    99.6 F (37.6 C)  TempSrc:    Oral  SpO2: 97% 95% 94% 95%  Weight:      Height:       Wt Readings from Last 3 Encounters:  12/09/17 79.4 kg    PHYSICAL EXAMINATION:  Physical Exam  Vitals reviewed. Constitutional: He is oriented to person, place, and time. He appears well-developed and well-nourished. No distress.  HENT:  Head: Normocephalic and atraumatic.  Mouth/Throat: Oropharynx is clear and moist.  Eyes: Pupils are equal, round, and reactive to light. Conjunctivae and EOM are normal. No scleral icterus.  Neck: Normal range of motion. Neck supple. No JVD present. No thyromegaly present.  Cardiovascular: Normal rate and intact distal pulses. Exam reveals no  gallop and no friction rub.  No murmur heard. Irregular rhythm  Respiratory: Effort normal and breath sounds normal. No respiratory distress. He has no wheezes. He has no rales.  GI: Soft. Bowel sounds are normal. He exhibits no distension. There is tenderness.  Genitourinary:  Genitourinary Comments: Deferred.  Musculoskeletal: Normal range of motion. He exhibits no edema.  No arthritis, no gout  Lymphadenopathy:    He has no cervical adenopathy.  Neurological: He is alert and oriented to person, place, and time. No cranial nerve deficit.  No dysarthria, no aphasia  Skin: Skin is warm. No rash noted. He is not diaphoretic. No erythema.  Psychiatric: He has a normal mood and affect. His behavior is normal. Judgment and thought content normal.    LABORATORY PANEL:   CBC Recent Labs  Lab 12/09/17 1726  WBC 2.5*  HGB 10.8*  HCT 34.4*  PLT 116*   ------------------------------------------------------------------------------------------------------------------  Chemistries  Recent Labs  Lab 12/09/17 1726  NA 142  K 3.5  CL 105  CO2 27  GLUCOSE 174*  BUN 22  CREATININE 2.40*  CALCIUM 8.8*  MG 1.3*  AST 227*  ALT 70*  ALKPHOS 373*  BILITOT 3.4*   ------------------------------------------------------------------------------------------------------------------  Cardiac Enzymes Recent Labs  Lab 12/09/17 1726  TROPONINI 0.03*   ------------------------------------------------------------------------------------------------------------------  RADIOLOGY:  Dg Chest Portable 1 View  Result Date: 12/09/2017 CLINICAL DATA:  Nausea, vomiting, and abdominal pain. EXAM: PORTABLE CHEST 1 VIEW COMPARISON:  February 27, 2013 FINDINGS: A calcified nodule in the right base remains. No pneumothorax. No uncalcified nodules or masses. No suspicious infiltrates. Cardiomegaly. Stable AICD device. No other acute abnormalities. IMPRESSION: No active disease. Electronically Signed   By:  Dorise Bullion III M.D   On: 12/09/2017 18:48   US Abdomen Limited Ruq  Result Date: 12/09/2017 CLINICAL DATA:  Transaminitis with abdominal pain. Nausea and vomiting for 1 hour. History of lung cancer. EXAM: ULTRASOUND ABDOMEN LIMITED RIGHT UPPER QUADRANT COMPARISON:  None. FINDINGS: Gallbladder: There are several non mobile stones near the neck of the gallbladder. The gallbladder wall is borderline to mildly thickened measuring 3.4 mm. No Murphy's sign or pericholecystic fluid. Gallbladder sludge noted. Common bile duct: Diameter: 7.4 mm Liver: There is a hyperechoic mass in the left hepatic lobe measuring 2.9 x 2.6 x 2.4 cm. Intrahepatic ductal dilatation is noted. Other: Right pleural effusion. Portal vein is patent on color Doppler imaging with normal direction of blood flow towards the liver. IMPRESSION: 1. Non mobile stones near the neck of the gallbladder. The gallbladder wall is borderline to mildly thickened. Sludge. No Murphy's sign or pericholecystic fluid. A HIDA scan could further evaluate if there is continued concern for acute cholecystitis. 2. The common bile duct is mildly dilated measuring 7.4 mm. There is also some intrahepatic ductal dilatation. Recommend correlation with labs. An MRCP could better evaluate the intra and extrahepatic bile ducts. 3. There is a hyperechoic mass in the left hepatic lobe measuring up to 2.9 cm. While a hemangioma is possible, the mass is nonspecific and neoplasm/malignancy not excluded. Recommend an MRI of this mass to further characterize. The MRI of this mass could occur as an outpatient. 4. Right pleural effusion. Electronically Signed   By: Dorise Bullion III M.D   On: 12/09/2017 20:00    EKG:   Orders placed or performed during the hospital encounter of 12/09/17  . EKG 12-Lead  . EKG 12-Lead    IMPRESSION AND PLAN:  Principal Problem:     Defibrillator discharge: AICD shocked patient in ED once, pacemaker interrogated by [employer].  Current  rhythm A-fib, rate controlled post Amio bolus. Patient admitted to Telemetry with continuous cardiac monitoring, Cardiology consulted, echocardiogram ordered.  Active Problems:   Cholelithiasis: HIDA scan ordered, based on results with consult surgery versus GI.  Patient states he has been told not to "let anyone put me to sleep".    Atrial Fibrillation: Patient Xarelto discontinued due to reduced GFR, pharmacy consulted for heparin gtt.  Home antiarrhythmic                              Continued.   Acute on Chronic Kidney Injury: Likely related to cardiorenal syndrome, though some dehydration given his recent vomiting could be a contributing factor.  We will not administer IVF due to his historically very low EF. Avoid nephrotoxins and monitor.    CAD (coronary artery disease): home  medications continued    HTN (hypertension): home medications continued    COPD (chronic obstructive pulmonary disease) ([place]): home medications continued    Chronic combined systolic and diastolic CHF (congestive heart failure) ([place]): home medications continued    GERD (gastroesophageal reflux disease): PPI ordered, zofran PRN given  Patient has requested to be transferred to the [facility] once Cardiology considers the patient stable to transfer.  Chart review performed and case discussed with ED provider. Labs, imaging and/or ECG reviewed by provider and discussed with patient/family. Management plans discussed with the patient and/or family.  DVT PROPHYLAXIS: Systemic anticoagulation  GI PROPHYLAXIS:  PPI   ADMISSION STATUS: Inpatient     CODE STATUS: Full  TOTAL TIME TAKING CARE OF THIS PATIENT: 45 minutes.   Jannifer Franklin, Ulus Hazen FIELDING 12/09/2017, 10:14 PM  [employer]  [phone]  CC: Primary care physician; Burnell Blanks, MD  Note:  This document was prepared using Dragon voice recognition software and may include unintentional dictation errors.

## 2017-12-09 NOTE — ED Provider Notes (Signed)
[facility] Emergency Department Provider Note  ____________________________________________  Time seen: Approximately 5:51 PM  I have reviewed the triage vital signs and the nursing notes.   HISTORY  Chief Complaint Nausea   HPI [name] is a 77 y.o. male with a history of COPD, CAD status post CABG, lung cancer on radiation therapy, congestive heart failure status post AICD, AAA who presents for evaluation of nausea.  Patient reports that he started feeling short of breath yesterday evening.  He did a nebulizer treatment and felt better.  This morning he had persistent shortness of breath worse with ambulation but went to visit his sister who was in a nursing home.  While there patient developed sudden onset of nausea and had several episodes of nonbloody nonbilious emesis.  He reports burning pain located in his epigastric region which has now resolved.  He reports feeling dizzy like he was going to pass out but no chest pain or back pain. Upon arrival to the ED, patient was shocked by his defibrilator x 1. He denies any current abdominal pain, CP, nausea, or back pain.   Past Medical History:  Diagnosis Date  . AAA (abdominal aortic aneurysm) ([place])   . Cancer ([place])   . CHF (congestive heart failure) ([place])   . COPD (chronic obstructive pulmonary disease) ([place])   . Hx of CABG   . Lung cancer [facility])     Prior to Admission medications   Medication Sig Start Date End Date Taking? Authorizing Provider  atorvastatin (LIPITOR) 40 MG tablet Take 0.5 tablets by mouth daily. 03/19/12   [provider]  finasteride (PROSCAR) 5 MG tablet Take 1 tablet by mouth daily. 10/06/10   [provider]  metoprolol succinate (TOPROL-XL) 25 MG 24 hr tablet Take 0.5 tablets by mouth daily. 06/05/17 06/05/18  [provider]  mexiletine (MEXITIL) 150 MG capsule Take 1 capsule by mouth daily.    [provider]  midodrine (PROAMATINE) 2.5 MG  tablet Take 1 tablet by mouth 3 (three) times daily.    [provider]  omeprazole (PRILOSEC) 20 MG capsule Take 1 capsule by mouth daily. 03/30/11   [provider]  Rivaroxaban (XARELTO) 15 MG TABS tablet Take 15 mg by mouth 2 (two) times daily with a meal.    [provider]  tamsulosin (FLOMAX) 0.4 MG CAPS capsule Take 1 capsule by mouth daily. 10/06/10   [provider]  Tiotropium Bromide-Olodaterol (STIOLTO RESPIMAT) 2.5-2.5 MCG/ACT AERS Inhale 2 puffs into the lungs daily.    [provider]    Allergies Dofetilide  No family history on file.  Social History Social History   Tobacco Use  . Smoking status: Not on file  Substance Use Topics  . Alcohol use: Not on file  . Drug use: Not on file    Review of Systems  Constitutional: Negative for fever. + dizziness Eyes: Negative for visual changes. ENT: Negative for sore throat. Neck: No neck pain  Cardiovascular: Negative for chest pain. Respiratory: + shortness of breath. Gastrointestinal: + epigastric burning abdominal pain, vomiting. No diarrhea. Genitourinary: Negative for dysuria. Musculoskeletal: Negative for back pain. Skin: Negative for rash. Neurological: Negative for headaches, weakness or numbness. Psych: No SI or HI  ____________________________________________   PHYSICAL EXAM:  VITAL SIGNS: ED Triage Vitals [12/09/17 1721]  Enc Vitals Group     BP (!) 162/84     Pulse Rate 99     Resp (!) 22  Temp 99.2 F (37.3 C)     Temp Source Oral     SpO2 95 %     Weight 175 lb (79.4 kg)     Height 6' (1.829 m)     Head Circumference      Peak Flow      Pain Score 0     Pain Loc      Pain Edu?      Excl. in [place]?     Constitutional: Alert and oriented. Well appearing and in no apparent distress. HEENT:      Head: Normocephalic and atraumatic.         Eyes: Conjunctivae are normal. Sclera is non-icteric.       Mouth/Throat: Mucous membranes are moist.         Neck: Supple with no signs of meningismus. Cardiovascular: Regular rate and rhythm. No murmurs, gallops, or rubs. 2+ symmetrical distal pulses are present in all extremities. No JVD. Respiratory: Normal respiratory effort. Decreased air movement bilaterally with faint wheezes Gastrointestinal: Soft, non tender, and non distended with positive bowel sounds. No rebound or guarding. Musculoskeletal: Nontender with normal range of motion in all extremities. No edema, cyanosis, or erythema of extremities. Neurologic: Normal speech and language. Face is symmetric. Moving all extremities. No gross focal neurologic deficits are appreciated. Skin: Skin is warm, dry and intact. No rash noted. Psychiatric: Mood and affect are normal. Speech and behavior are normal.  ____________________________________________   LABS (all labs ordered are listed, but only abnormal results are displayed)  Labs Reviewed  CBC WITH DIFFERENTIAL/PLATELET - Abnormal; Notable for the following components:      Result Value   WBC 2.5 (*)    RBC 3.57 (*)    Hemoglobin 10.8 (*)    HCT 34.4 (*)    Platelets 116 (*)    Lymphs Abs 0.2 (*)    All other components within normal limits  COMPREHENSIVE METABOLIC PANEL - Abnormal; Notable for the following components:   Glucose, Bld 174 (*)    Creatinine, Ser 2.40 (*)    Calcium 8.8 (*)    AST 227 (*)    ALT 70 (*)    Alkaline Phosphatase 373 (*)    Total Bilirubin 3.4 (*)    GFR calc non Af Amer 25 (*)    GFR calc Af Amer 29 (*)    All other components within normal limits  TROPONIN I - Abnormal; Notable for the following components:   Troponin I 0.03 (*)    All other components within normal limits  MAGNESIUM - Abnormal; Notable for the following components:   Magnesium 1.3 (*)    All other components within normal limits  LIPASE, BLOOD   ____________________________________________  EKG  ED ECG REPORT I, Rudene Re, the attending physician,  personally viewed and interpreted this ECG.  Atrial fibrillation, rate of 117, LVH, normal QTC, right axis deviation, no ST depressions in V5, no ST elevation.  Unchanged from prior from 2015. ____________________________________________  RADIOLOGY  I have personally reviewed the images performed during this visit and I agree with the Radiologist's read.   Interpretation by Radiologist:  Dg Chest Portable 1 View  Result Date: 12/09/2017 CLINICAL DATA:  Nausea, vomiting, and abdominal pain. EXAM: PORTABLE CHEST 1 VIEW COMPARISON:  February 27, 2013 FINDINGS: A calcified nodule in the right base remains. No pneumothorax. No uncalcified nodules or masses. No suspicious infiltrates. Cardiomegaly. Stable AICD device. No other acute abnormalities. IMPRESSION: No active disease. Electronically Signed  By: Dorise Bullion III M.D   On: 12/09/2017 18:48   US Abdomen Limited Ruq  Result Date: 12/09/2017 CLINICAL DATA:  Transaminitis with abdominal pain. Nausea and vomiting for 1 hour. History of lung cancer. EXAM: ULTRASOUND ABDOMEN LIMITED RIGHT UPPER QUADRANT COMPARISON:  None. FINDINGS: Gallbladder: There are several non mobile stones near the neck of the gallbladder. The gallbladder wall is borderline to mildly thickened measuring 3.4 mm. No Murphy's sign or pericholecystic fluid. Gallbladder sludge noted. Common bile duct: Diameter: 7.4 mm Liver: There is a hyperechoic mass in the left hepatic lobe measuring 2.9 x 2.6 x 2.4 cm. Intrahepatic ductal dilatation is noted. Other: Right pleural effusion. Portal vein is patent on color Doppler imaging with normal direction of blood flow towards the liver. IMPRESSION: 1. Non mobile stones near the neck of the gallbladder. The gallbladder wall is borderline to mildly thickened. Sludge. No Murphy's sign or pericholecystic fluid. A HIDA scan could further evaluate if there is continued concern for acute cholecystitis. 2. The common bile duct is mildly dilated  measuring 7.4 mm. There is also some intrahepatic ductal dilatation. Recommend correlation with labs. An MRCP could better evaluate the intra and extrahepatic bile ducts. 3. There is a hyperechoic mass in the left hepatic lobe measuring up to 2.9 cm. While a hemangioma is possible, the mass is nonspecific and neoplasm/malignancy not excluded. Recommend an MRI of this mass to further characterize. The MRI of this mass could occur as an outpatient. 4. Right pleural effusion. Electronically Signed   By: Dorise Bullion III M.D   On: 12/09/2017 20:00      ____________________________________________   PROCEDURES  Procedure(s) performed: None Procedures Critical Care performed: yes  CRITICAL CARE Performed by: Rudene Re  ?  Total critical care time: 45 min  Critical care time was exclusive of separately billable procedures and treating other patients.  Critical care was necessary to treat or prevent imminent or life-threatening deterioration.  Critical care was time spent personally by me on the following activities: development of treatment plan with patient and/or surrogate as well as nursing, discussions with consultants, evaluation of patient's response to treatment, examination of patient, obtaining history from patient or surrogate, ordering and performing treatments and interventions, ordering and review of laboratory studies, ordering and review of radiographic studies, pulse oximetry and re-evaluation of patient's condition.  ____________________________________________   INITIAL IMPRESSION / ASSESSMENT AND PLAN / ED COURSE  77 y.o. male with a history of COPD, CAD status post CABG, lung cancer on radiation therapy, afib on Eliquis, congestive heart failure status post AICD, AAA who presents for evaluation of nausea, vomiting, dizziness, abdominal pain, and shock from defibrillator.  Patient is in atrial fibrillation with well-controlled ventricular rate, no acute ischemic  changes.  His abdomen is soft with no pulsatile mass and no tenderness.  Lungs have diminished breath sounds bilaterally with wheezing consistent with a COPD.  Patient is on Eliquis for A. fib therefore less likely PE. Ddx ACS, dysrhythmia, leaking AAA, pneumonia, COPD exacerbation.  Labs, chest x-ray pending.  We will send patient for CT abdomen pelvis to evaluate his AAA.  At this time we will hold off DuoNeb's in the setting of recent shocking by patient's defibrillator.  We will interrogate pacemaker.  Clinical Course as of Dec 09 2013  Sat Dec 09, 2017  1851 Bedside ultrasound done to evaluate AAA which measures 3 cm in diameter with no intra-abdominal fluid or flap seen.  During my evaluation patient had a 3-second  run of V. tach, he developed chest pain and felt dizzy.  His blood pressure remained stable.  Patient did not lose consciousness.  Will loaded with amiodarone.  LFTs are elevated and patient was noted to have a very distended gallbladder with sludge on bedside ultrasound.  Formal ultrasound has been sent to evaluate for cholecystitis.   [CV]    Clinical Course User Index [CV] Alfred Levins [place], MD   _________________________ 8:12 PM on 12/09/2017 -----------------------------------------  [place] equivocal recommended HIDA scan. Patient remains with no tenderness or pain on exam. HIDA scan ordered. Will give zosyn to prevent ascending cholangitis in case there is obstruction of the CBD. After receiving bolus of amiodarone and mag, patient has not had other runs of Vtach. Recommended amiodarone drip but patient is refusing because he was told by his doctor that he had an allergic reaction in the past.  His allergic reaction was that he felt dizzy and unable to tolerated p.o.  I explained to him that he did just fine with the bolus but he continues to refuse.  Since patient has a defibrillator and has not had any further runs of V. tach after receiving the bolus and the IV magnesium, I have  cancelled the drip. Discussed with Hospitalist for admission since patient is too unstable to be transferred to the [place].  As part of my medical decision making, I reviewed the following data within the [place] notes reviewed and incorporated, Labs reviewed , EKG interpreted , Old EKG reviewed, Old chart reviewed, Radiograph reviewed , Discussed with admitting physician , Notes from prior ED visits and [place] Controlled Substance Database    Pertinent labs & imaging results that were available during my care of the patient were reviewed by me and considered in my medical decision making (see chart for details).    ____________________________________________   FINAL CLINICAL IMPRESSION(S) / ED DIAGNOSES  Final diagnoses:  Transaminitis  Nausea and vomiting  Abdominal pain  V-tach (HCC)  Calculus of gallbladder with biliary obstruction but without cholecystitis  Hypomagnesemia  Near syncope      NEW MEDICATIONS STARTED DURING THIS VISIT:  ED Discharge Orders    None       Note:  This document was prepared using Dragon voice recognition software and may include unintentional dictation errors.    Rudene Re, MD 12/09/17 2015

## 2017-12-09 NOTE — ED Notes (Signed)
Pt refused medication. EDP aware.

## 2017-12-09 NOTE — ED Notes (Signed)
Pt assisted to restroom by this RN and Daiva Nakayama, Therapist, sports. Pt very unsteady on his feet and c/o dizziness when changing positions. Pt vital signs recheck upon returning to bed.

## 2017-12-09 NOTE — Progress Notes (Addendum)
ANTICOAGULATION CONSULT NOTE - Initial Consult  Pharmacy Consult for heparin drip Indication: atrial fibrillation  Allergies  Allergen Reactions  . Dofetilide Other (See Comments)    Reaction: Syncope  . Amiodarone Other (See Comments)    Reaction: weakness    Patient Measurements: Height: 6' (182.9 cm) Weight: 175 lb (79.4 kg) IBW/kg (Calculated) : 77.6 Heparin Dosing Weight: 79 kg  Vital Signs: Temp: 99.6 F (37.6 C) (11/02 2010) Temp Source: Oral (11/02 2010) BP: 122/80 (11/02 2010) Pulse Rate: 101 (11/02 2010)  Labs: Recent Labs    12/09/17 1726  HGB 10.8*  HCT 34.4*  PLT 116*  CREATININE 2.40*  TROPONINI 0.03*    Estimated Creatinine Clearance: 28.7 mL/min (A) (by C-G formula based on SCr of 2.4 mg/dL (H)).   Medical History: Past Medical History:  Diagnosis Date  . AAA (abdominal aortic aneurysm) ([place])   . Cancer ([place])   . CHF (congestive heart failure) ([place])   . COPD (chronic obstructive pulmonary disease) ([place])   . GERD (gastroesophageal reflux disease)   . HTN (hypertension)   . Hx of CABG   . Lung cancer ([place])     Medications:  Rivaroxaban as outpatient  Assessment: Anti-Xa 1.09 on admission.  Goal of Therapy:  Heparin level 0.3-0.7 units/ml Monitor platelets by anticoagulation protocol: Yes   Plan:  4700 unit bolus and initial rate of 1200 units/hr. First heparin level 8 hours after start of infusion. Will monitor and adjust by aPTT until correlation with anti-Xa levels.  [name] S 12/09/2017,11:15 PM

## 2017-12-10 ENCOUNTER — Inpatient Hospital Stay (HOSPITAL_COMMUNITY)
Admit: 2017-12-10 | Discharge: 2017-12-10 | Disposition: A | Discharge status: Home / Self Care | Payer: No Typology Code available for payment source | Attending: Internal Medicine | Admitting: Internal Medicine

## 2017-12-10 DIAGNOSIS — I472 Ventricular tachycardia: Secondary | ICD-10-CM

## 2017-12-10 DIAGNOSIS — I351 Nonrheumatic aortic (valve) insufficiency: Secondary | ICD-10-CM

## 2017-12-10 DIAGNOSIS — I340 Nonrheumatic mitral (valve) insufficiency: Secondary | ICD-10-CM

## 2017-12-10 LAB — ECHOCARDIOGRAM COMPLETE
HEIGHTINCHES: 72 in
Weight: 2620.8 oz

## 2017-12-10 LAB — CBC
HCT: 29.4 % — ABNORMAL LOW (ref 39.0–52.0)
Hemoglobin: 9.2 g/dL — ABNORMAL LOW (ref 13.0–17.0)
MCH: 30.3 pg (ref 26.0–34.0)
MCHC: 31.3 g/dL (ref 30.0–36.0)
MCV: 96.7 fL (ref 80.0–100.0)
NRBC: 0 % (ref 0.0–0.2)
Platelets: 94 10*3/uL — ABNORMAL LOW (ref 150–400)
RBC: 3.04 MIL/uL — ABNORMAL LOW (ref 4.22–5.81)
RDW: 14.7 % (ref 11.5–15.5)
WBC: 4.8 10*3/uL (ref 4.0–10.5)

## 2017-12-10 LAB — PROTIME-INR
INR: 1.65
Prothrombin Time: 19.3 seconds — ABNORMAL HIGH (ref 11.4–15.2)

## 2017-12-10 LAB — BASIC METABOLIC PANEL
Anion gap: 10 (ref 5–15)
BUN: 25 mg/dL — ABNORMAL HIGH (ref 8–23)
CO2: 27 mmol/L (ref 22–32)
CREATININE: 2.44 mg/dL — AB (ref 0.61–1.24)
Calcium: 8.3 mg/dL — ABNORMAL LOW (ref 8.9–10.3)
Chloride: 102 mmol/L (ref 98–111)
GFR calc non Af Amer: 24 mL/min — ABNORMAL LOW (ref >60)
GFR, EST AFRICAN AMERICAN: 28 mL/min — AB (ref >60)
Glucose, Bld: 122 mg/dL — ABNORMAL HIGH (ref 70–99)
Potassium: 3.5 mmol/L (ref 3.5–5.1)
SODIUM: 139 mmol/L (ref 135–145)

## 2017-12-10 LAB — APTT
aPTT: 38 seconds — ABNORMAL HIGH (ref 24–36)
aPTT: >160 seconds (ref 24–36)
aPTT: 140 seconds — ABNORMAL HIGH (ref 24–36)

## 2017-12-10 LAB — TROPONIN I
TROPONIN I: 0.09 ng/mL — AB (ref <0.03)
TROPONIN I: 0.14 ng/mL — AB (ref <0.03)
Troponin I: 0.11 ng/mL (ref <0.03)

## 2017-12-10 LAB — HEPARIN LEVEL (UNFRACTIONATED): Heparin Unfractionated: 1.09 IU/mL — ABNORMAL HIGH (ref 0.30–0.70)

## 2017-12-10 MED ORDER — ACETAMINOPHEN 325 MG PO TABS
650.0000 mg | ORAL_TABLET | Freq: Four times a day (QID) | ORAL | Status: DC | PRN
  Administered 2017-12-10 – 2017-12-13 (×4): 650 mg via ORAL
  Filled 2017-12-10 (×4): qty 2

## 2017-12-10 MED ORDER — METOPROLOL SUCCINATE ER 25 MG PO TB24
37.5000 mg | ORAL_TABLET | Freq: Every day | ORAL | Status: DC
  Administered 2017-12-10 – 2017-12-14 (×5): 37.5 mg via ORAL
  Filled 2017-12-10 (×5): qty 2

## 2017-12-10 MED ORDER — POTASSIUM CHLORIDE CRYS ER 20 MEQ PO TBCR
20.0000 meq | EXTENDED_RELEASE_TABLET | Freq: Two times a day (BID) | ORAL | Status: DC
  Administered 2017-12-10 – 2017-12-12 (×4): 20 meq via ORAL
  Filled 2017-12-10 (×4): qty 1

## 2017-12-10 MED ORDER — HEPARIN (PORCINE) IN NACL 100-0.45 UNIT/ML-% IJ SOLN
1000.0000 [IU]/h | INTRAMUSCULAR | Status: DC
  Administered 2017-12-10 (×2): 1000 [IU]/h via INTRAVENOUS
  Administered 2017-12-11: 850 [IU]/h via INTRAVENOUS
  Filled 2017-12-10 (×2): qty 250

## 2017-12-10 MED ORDER — MAGNESIUM SULFATE 2 GM/50ML IV SOLN
2.0000 g | Freq: Once | INTRAVENOUS | Status: AC
  Administered 2017-12-10: 2 g via INTRAVENOUS
  Filled 2017-12-10: qty 50

## 2017-12-10 MED ORDER — UMECLIDINIUM BROMIDE 62.5 MCG/INH IN AEPB
1.0000 | INHALATION_SPRAY | Freq: Every day | RESPIRATORY_TRACT | Status: DC
  Administered 2017-12-10 – 2017-12-14 (×5): 1 via RESPIRATORY_TRACT
  Filled 2017-12-10: qty 7

## 2017-12-10 MED ORDER — ARFORMOTEROL TARTRATE 15 MCG/2ML IN NEBU
15.0000 ug | INHALATION_SOLUTION | Freq: Two times a day (BID) | RESPIRATORY_TRACT | Status: DC
  Administered 2017-12-10 – 2017-12-14 (×8): 15 ug via RESPIRATORY_TRACT
  Filled 2017-12-10 (×11): qty 2

## 2017-12-10 MED ORDER — MIDODRINE HCL 5 MG PO TABS
5.0000 mg | ORAL_TABLET | Freq: Three times a day (TID) | ORAL | Status: DC
  Administered 2017-12-10 – 2017-12-14 (×13): 5 mg via ORAL
  Filled 2017-12-10 (×13): qty 1

## 2017-12-10 MED ORDER — ONDANSETRON HCL 4 MG/2ML IJ SOLN
4.0000 mg | Freq: Four times a day (QID) | INTRAMUSCULAR | Status: DC | PRN
  Administered 2017-12-10 – 2017-12-14 (×4): 4 mg via INTRAVENOUS
  Filled 2017-12-10 (×4): qty 2

## 2017-12-10 MED ORDER — ATORVASTATIN CALCIUM 20 MG PO TABS
20.0000 mg | ORAL_TABLET | Freq: Every day | ORAL | Status: DC
  Administered 2017-12-10 – 2017-12-14 (×5): 20 mg via ORAL
  Filled 2017-12-10 (×5): qty 1

## 2017-12-10 MED ORDER — FINASTERIDE 5 MG PO TABS
5.0000 mg | ORAL_TABLET | Freq: Every day | ORAL | Status: DC
  Administered 2017-12-10 – 2017-12-14 (×5): 5 mg via ORAL
  Filled 2017-12-10 (×5): qty 1

## 2017-12-10 MED ORDER — TAMSULOSIN HCL 0.4 MG PO CAPS
0.4000 mg | ORAL_CAPSULE | Freq: Every day | ORAL | Status: DC
  Administered 2017-12-10 – 2017-12-14 (×5): 0.4 mg via ORAL
  Filled 2017-12-10 (×5): qty 1

## 2017-12-10 MED ORDER — ONDANSETRON HCL 4 MG PO TABS: 4.0000 mg | ORAL_TABLET | Freq: Four times a day (QID) | ORAL | Status: DC | PRN

## 2017-12-10 MED ORDER — RAMELTEON 8 MG PO TABS
8.0000 mg | ORAL_TABLET | Freq: Every day | ORAL | Status: DC
  Administered 2017-12-10 – 2017-12-11 (×3): 8 mg via ORAL
  Filled 2017-12-10 (×6): qty 1

## 2017-12-10 MED ORDER — ACETAMINOPHEN 650 MG RE SUPP: 650.0000 mg | Freq: Four times a day (QID) | RECTAL | Status: DC | PRN

## 2017-12-10 MED ORDER — MEXILETINE HCL 150 MG PO CAPS
150.0000 mg | ORAL_CAPSULE | Freq: Three times a day (TID) | ORAL | Status: DC
  Administered 2017-12-10 – 2017-12-14 (×13): 150 mg via ORAL
  Filled 2017-12-10 (×16): qty 1

## 2017-12-10 MED ORDER — FUROSEMIDE 20 MG PO TABS: 20.0000 mg | ORAL_TABLET | Freq: Two times a day (BID) | ORAL | Status: DC | PRN

## 2017-12-10 MED ORDER — PANTOPRAZOLE SODIUM 40 MG PO TBEC
40.0000 mg | DELAYED_RELEASE_TABLET | Freq: Every day | ORAL | Status: DC
  Administered 2017-12-10 – 2017-12-14 (×5): 40 mg via ORAL
  Filled 2017-12-10 (×5): qty 1

## 2017-12-10 NOTE — Consult Note (Addendum)
Cardiology Consultation:   Patient ID: AHMARI DUERSON MRN: [id number]; DOB: [birth year]  Admit date: 12/09/2017 Date of Consult: 12/10/2017  Primary Care Provider: Burnell Blanks, MD Primary Cardiologist: No primary care provider on file.  Primary Electrophysiologist:  None    Patient Profile:   [name] is a 77 y.o. male with a hx of advanced heart failure who is being seen today for the evaluation of ICD discharge at the request of Dr Anselm Jungling.  History of Present Illness:   Mr. Hoffmeier has an extensive history of heart disease.  He has a history of multivessel CABG.  Prior to CABG he underwent PCI procedures.  Details are not available but all of his care has been through the [place] system.  The patient has advanced heart failure with an LV ejection fraction of 10 to 15%.  He has undergone ICD implantation.  He is been on antiarrhythmic drugs to try to suppress ventricular fibrillation, but has had difficulty tolerating this.  States that he has been on amiodarone 3 times but could not tolerate this drug at all.  He is also been treated with mexiletine and has required midodrine to manage severe orthostatic hypotension.  The patient presented yesterday with nausea and vomiting.  While he was in the emergency room his ICD discharged.  He states that he became very lightheaded and nearly "blacked out" prior to the ICD discharge.  He had a second episode that was not as severe and he reports that his ICD did not discharge a second time.  Device interrogation demonstrated appropriate ICD discharge for ventricular tachycardia as well as 2 episodes of antitachycardia pacing.  The patient has no chest pain at present.  He did report progressive shortness of breath with activity yesterday.  He is chronically short of breath with activity.  He denies orthopnea, PND, or leg swelling.  In review of outside notes he has been diagnosed with early stage non-small cell lung cancer and is planning on  undergoing radiation therapy.  At the time of my evaluation, the patient request to be transferred to the [facility] since all of his doctors are there and his cardiac care has been in that hospital.  Past Medical History:  Diagnosis Date  . AAA (abdominal aortic aneurysm) ([place])   . Cancer ([place])   . CHF (congestive heart failure) ([place])   . COPD (chronic obstructive pulmonary disease) ([place])   . GERD (gastroesophageal reflux disease)   . HTN (hypertension)   . Hx of CABG   . Lung cancer [facility])     Past Surgical History:  Procedure Laterality Date  . CARDIAC DEFIBRILLATOR PLACEMENT    . CORONARY ARTERY BYPASS GRAFT       Home Medications:  Prior to Admission medications   Medication Sig Start Date End Date Taking? Authorizing Provider  atorvastatin (LIPITOR) 40 MG tablet Take 0.5 tablets by mouth at bedtime.  03/19/12  Yes [provider]  finasteride (PROSCAR) 5 MG tablet Take 1 tablet by mouth daily. 10/06/10  Yes [provider]  furosemide (LASIX) 20 MG tablet Take 20 mg by mouth 2 (two) times daily as needed for fluid.   Yes [provider]  metoprolol succinate (TOPROL-XL) 25 MG 24 hr tablet Take 37.5 mg by mouth at bedtime.  06/05/17 06/05/18 Yes [provider]  mexiletine (MEXITIL) 150 MG capsule Take 1 capsule by mouth 3 (three) times daily.    Yes [provider]  midodrine (PROAMATINE) 5 MG tablet  Take 5 mg by mouth 3 (three) times daily.    Yes [provider]  omeprazole (PRILOSEC) 20 MG capsule Take 1 capsule by mouth 2 (two) times daily.  03/30/11  Yes [provider]  Rivaroxaban (XARELTO) 15 MG TABS tablet Take 15 mg by mouth daily.    Yes [provider]  tamsulosin (FLOMAX) 0.4 MG CAPS capsule Take 1 capsule by mouth at bedtime.  10/06/10  Yes [provider]  Tiotropium Bromide-Olodaterol (STIOLTO RESPIMAT) 2.5-2.5 MCG/ACT AERS Inhale 2 puffs into the lungs daily.   Yes [provider]  nitroGLYCERIN (NITROSTAT) 0.4 MG SL tablet Place 0.4 mg under the tongue every 5 (five) minutes as needed for chest pain.    [provider]    Inpatient Medications: Scheduled Meds: . arformoterol  15 mcg Nebulization BID  . atorvastatin  20 mg Oral Daily  . finasteride  5 mg Oral Daily  . metoprolol succinate  37.5 mg Oral Daily  . mexiletine  150 mg Oral Q8H  . midodrine  5 mg Oral TID  . pantoprazole  40 mg Oral Daily  . ramelteon  8 mg Oral QHS  . tamsulosin  0.4 mg Oral Daily  . umeclidinium bromide  1 puff Inhalation Daily   Continuous Infusions: . heparin 1,000 Units/hr (12/10/17 0955)   PRN Meds: acetaminophen **OR** acetaminophen, furosemide, ondansetron **OR** ondansetron (ZOFRAN) IV  Allergies:    Allergies  Allergen Reactions  . Dofetilide Other (See Comments)    Reaction: Syncope  . Amiodarone Other (See Comments)    Reaction: weakness    Social History:   Social History   Socioeconomic History  . Marital status: Married    Spouse name: Not on file  . Number of children: Not on file  . Years of education: Not on file  . Highest education level: Not on file  Occupational History  . Not on file  Social Needs  . Financial resource strain: Not on file  . Food insecurity:    Worry: Not on file    Inability: Not on file  . Transportation needs:    Medical: Not on file    Non-medical: Not on file  Tobacco Use  . Smoking status: Former Smoker    Years: 15.00    Types: Cigarettes  . Smokeless tobacco: Former Systems developer    Types: Snuff  . Tobacco comment: quit 3 years ago  Substance and Sexual Activity  . Alcohol use: Not Currently  . Drug use: Not Currently  . Sexual activity: Not on file  Lifestyle  . Physical activity:    Days per week: Not on file    Minutes per session: Not on file  . Stress: Not on file  Relationships  . Social connections:    Talks on phone: Not on file    Gets together: Not on file    Attends religious service:  Not on file    Active member of club or organization: Not on file    Attends meetings of clubs or organizations: Not on file    Relationship status: Not on file  . Intimate partner violence:    Fear of current or ex partner: Not on file    Emotionally abused: Not on file    Physically abused: Not on file    Forced sexual activity: Not on file  Other Topics Concern  . Not on file  Social History Narrative  . Not on file    Family History:  Family History  Problem Relation Age of Onset  . Heart disease Mother   . Diabetes Father   . COPD Sister   . COPD Brother      ROS:  Please see the history of present illness.  Positive for generalized weakness and fatigue. All other ROS reviewed and negative.     Physical Exam/Data:   Vitals:   12/10/17 0300 12/10/17 0453 12/10/17 0730 12/10/17 0850  BP:  108/72 107/60   Pulse:  70 65   Resp:  17    Temp:  97.7 F (36.5 C) 98 F (36.7 C)   TempSrc:  Oral Oral   SpO2:  97% 97% 99%  Weight: 74.3 kg     Height:        Intake/Output Summary (Last 24 hours) at 12/10/2017 1410 Last data filed at 12/10/2017 1325 Gross per 24 hour  Intake 50 ml  Output 275 ml  Net -225 ml   Filed Weights   12/09/17 1721 12/10/17 0300  Weight: 79.4 kg 74.3 kg   Body mass index is 22.22 kg/m.  General:  Elderly male, in no acute distress HEENT: normal Lymph: no adenopathy Neck: no JVD Endocrine:  No thryomegaly Vascular: BL carotid bruits  Cardiac:  normal S1, S2; RRR; 3/6 holosystolic murmur at the LV apex Lungs:  clear to auscultation bilaterally, no wheezing, rhonchi or rales  Abd: soft, nontender, no hepatomegaly  Ext: no edema Musculoskeletal:  No deformities, BUE and BLE strength normal and equal Skin: warm and dry  Neuro:  CNs 2-12 intact, no focal abnormalities noted Psych:  Normal affect   EKG:  The EKG was personally reviewed and demonstrates: Atrial fibrillation with nonspecific IVCD  Telemetry:  Telemetry was personally  reviewed and demonstrates: Atrial fibrillation  Relevant CV Studies: 2D echocardiogram is currently pending  Laboratory Data:  Chemistry Recent Labs  Lab 12/09/17 1726 12/10/17 0657  NA 142 139  K 3.5 3.5  CL 105 102  CO2 27 27  GLUCOSE 174* 122*  BUN 22 25*  CREATININE 2.40* 2.44*  CALCIUM 8.8* 8.3*  GFRNONAA 25* 24*  GFRAA 29* 28*  ANIONGAP 10 10    Recent Labs  Lab 12/09/17 1726  PROT 6.8  ALBUMIN 3.7  AST 227*  ALT 70*  ALKPHOS 373*  BILITOT 3.4*   Hematology Recent Labs  Lab 12/09/17 1726 12/10/17 0657  WBC 2.5* 4.8  RBC 3.57* 3.04*  HGB 10.8* 9.2*  HCT 34.4* 29.4*  MCV 96.4 96.7  MCH 30.3 30.3  MCHC 31.4 31.3  RDW 14.4 14.7  PLT 116* 94*   Cardiac Enzymes Recent Labs  Lab 12/09/17 1726 12/10/17 0042 12/10/17 0657 12/10/17 1232  TROPONINI 0.03* 0.14* 0.11* 0.09*   No results for input(s): TROPIPOC in the last 168 hours.  BNPNo results for input(s): BNP, PROBNP in the last 168 hours.  DDimer No results for input(s): DDIMER in the last 168 hours.  Radiology/Studies:  Dg Chest Portable 1 View  Result Date: 12/09/2017 CLINICAL DATA:  Nausea, vomiting, and abdominal pain. EXAM: PORTABLE CHEST 1 VIEW COMPARISON:  February 27, 2013 FINDINGS: A calcified nodule in the right base remains. No pneumothorax. No uncalcified nodules or masses. No suspicious infiltrates. Cardiomegaly. Stable AICD device. No other acute abnormalities. IMPRESSION: No active disease. Electronically Signed   By: Dorise Bullion III M.D   On: 12/09/2017 18:48   US Abdomen Limited Ruq  Result Date: 12/09/2017 CLINICAL DATA:  Transaminitis with abdominal pain. Nausea and vomiting for 1 hour.  History of lung cancer. EXAM: ULTRASOUND ABDOMEN LIMITED RIGHT UPPER QUADRANT COMPARISON:  None. FINDINGS: Gallbladder: There are several non mobile stones near the neck of the gallbladder. The gallbladder wall is borderline to mildly thickened measuring 3.4 mm. No Murphy's sign or  pericholecystic fluid. Gallbladder sludge noted. Common bile duct: Diameter: 7.4 mm Liver: There is a hyperechoic mass in the left hepatic lobe measuring 2.9 x 2.6 x 2.4 cm. Intrahepatic ductal dilatation is noted. Other: Right pleural effusion. Portal vein is patent on color Doppler imaging with normal direction of blood flow towards the liver. IMPRESSION: 1. Non mobile stones near the neck of the gallbladder. The gallbladder wall is borderline to mildly thickened. Sludge. No Murphy's sign or pericholecystic fluid. A HIDA scan could further evaluate if there is continued concern for acute cholecystitis. 2. The common bile duct is mildly dilated measuring 7.4 mm. There is also some intrahepatic ductal dilatation. Recommend correlation with labs. An MRCP could better evaluate the intra and extrahepatic bile ducts. 3. There is a hyperechoic mass in the left hepatic lobe measuring up to 2.9 cm. While a hemangioma is possible, the mass is nonspecific and neoplasm/malignancy not excluded. Recommend an MRI of this mass to further characterize. The MRI of this mass could occur as an outpatient. 4. Right pleural effusion. Electronically Signed   By: Dorise Bullion III M.D   On: 12/09/2017 20:00    Assessment and Plan:   1. Ventricular tachycardia status post ICD discharge 2. 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 Elevated troponin, flat low level elevation suspect demand ischemia rather than ACS 4. Heart murmur consistent with mitral regurgitation, likely ischemic/secondary 5. CAD status post CABG without anginal chest pain 6. CKD 4  The patient appears to have nearly end-stage heart disease.  He is intolerant to amiodarone.  He takes midodrine to maintain BP along with mexilitine TID.  He was treated with an appropriate ICD discharge and antitachycardia pacing while in the emergency room here.  Device interrogation  details are reviewed.  I do not think his mild troponin elevation requires further evaluation at this time as his troponin peak was only at 0.14 mg/dL, trending down to 0.11 and 0.09.  This is an expected troponin elevation related to ICD discharge in a patient whose creatinine is 2.44 mg/dL.  Would continue to replete potassium and magnesium to achieve levels greater than 4.0 and 2.0, respectively.  Would hold on IV diuresis as the patient appears to be breathing comfortably at rest and need to avoid further hypokalemia/hypomagnesemia.  Will review echo when available.  He appears clinically stable for transfer to the [facility] if this can be arranged.   For questions or updates, please contact [place] Please consult [email or web address] for contact info under     Signed, Sherren Mocha, MD  12/10/2017 2:10 PM

## 2017-12-10 NOTE — Progress Notes (Signed)
ANTICOAGULATION CONSULT NOTE - Initial Consult  Pharmacy Consult for heparin drip Indication: atrial fibrillation  Allergies  Allergen Reactions  . Dofetilide Other (See Comments)    Reaction: Syncope  . Amiodarone Other (See Comments)    Reaction: weakness    Patient Measurements: Height: 6' (182.9 cm) Weight: 163 lb 12.8 oz (74.3 kg) IBW/kg (Calculated) : 77.6 Heparin Dosing Weight: 79 kg  Vital Signs: Temp: 97.7 F (36.5 C) (11/03 1616) Temp Source: Oral (11/03 1616) BP: 87/54 (11/03 1616) Pulse Rate: 62 (11/03 1616)  Labs: Recent Labs    12/09/17 1726 12/10/17 0042 12/10/17 0657 12/10/17 1232 12/10/17 1622  HGB 10.8*  --  9.2*  --   --   HCT 34.4*  --  29.4*  --   --   PLT 116*  --  94*  --   --   APTT  --  38* >160*  --  140*  LABPROT  --  19.3*  --   --   --   INR  --  1.65  --   --   --   HEPARINUNFRC  --  1.09*  --   --   --   CREATININE 2.40*  --  2.44*  --   --   TROPONINI 0.03* 0.14* 0.11* 0.09*  --     Estimated Creatinine Clearance: 27.1 mL/min (A) (by C-G formula based on SCr of 2.44 mg/dL (H)).   Medical History: Past Medical History:  Diagnosis Date  . AAA (abdominal aortic aneurysm) ([place])   . Cancer ([place])   . CHF (congestive heart failure) ([place])   . COPD (chronic obstructive pulmonary disease) ([place])   . GERD (gastroesophageal reflux disease)   . HTN (hypertension)   . Hx of CABG   . Lung cancer ([place])     Medications:  Rivaroxaban as outpatient  Assessment: Anti-Xa 1.09 on admission.  Goal of Therapy:  Heparin level 0.3-0.7 units/ml Monitor platelets by anticoagulation protocol: Yes   Plan:  11/3 06:57 aPTT >160. Hold infusion x 1 hour then resume at 1000 units/hr. Will recheck aPTT 6 hours after infusion starts.  11/3 1622 aPTT = 140 Will decrease heparin drip rate to 850 units/hr and recheck aPTT 6 hrs after rate change.   Mora Pedraza D, Pharm.D. Clinical Pharmacist 12/10/2017,5:48 PM

## 2017-12-10 NOTE — Progress Notes (Signed)
Pts Aptt is elevated. Orders to hold for one hour is complete and rate reduced to 36ml/hr per rate adjustment. I will continue to assess.

## 2017-12-10 NOTE — Progress Notes (Signed)
ANTICOAGULATION CONSULT NOTE - Initial Consult  Pharmacy Consult for heparin drip Indication: atrial fibrillation  Allergies  Allergen Reactions  . Dofetilide Other (See Comments)    Reaction: Syncope  . Amiodarone Other (See Comments)    Reaction: weakness    Patient Measurements: Height: 6' (182.9 cm) Weight: 163 lb 12.8 oz (74.3 kg) IBW/kg (Calculated) : 77.6 Heparin Dosing Weight: 79 kg  Vital Signs: Temp: 98 F (36.7 C) (11/03 0730) Temp Source: Oral (11/03 0730) BP: 107/60 (11/03 0730) Pulse Rate: 65 (11/03 0730)  Labs: Recent Labs    12/09/17 1726 12/10/17 0042 12/10/17 0657  HGB 10.8*  --  9.2*  HCT 34.4*  --  29.4*  PLT 116*  --  94*  APTT  --  38* >160*  LABPROT  --  19.3*  --   INR  --  1.65  --   HEPARINUNFRC  --  1.09*  --   CREATININE 2.40*  --  2.44*  TROPONINI 0.03* 0.14* 0.11*    Estimated Creatinine Clearance: 27.1 mL/min (A) (by C-G formula based on SCr of 2.44 mg/dL (H)).   Medical History: Past Medical History:  Diagnosis Date  . AAA (abdominal aortic aneurysm) ([place])   . Cancer ([place])   . CHF (congestive heart failure) ([place])   . COPD (chronic obstructive pulmonary disease) ([place])   . GERD (gastroesophageal reflux disease)   . HTN (hypertension)   . Hx of CABG   . Lung cancer ([place])     Medications:  Rivaroxaban as outpatient  Assessment: Anti-Xa 1.09 on admission.  Goal of Therapy:  Heparin level 0.3-0.7 units/ml Monitor platelets by anticoagulation protocol: Yes   Plan:  11/3 06:57 aPTT >160. Hold infusion x 1 hour then resume at 1000 units/hr. Will recheck aPTT 6 hours after infusion starts.  Laural Benes, Pharm.D., BCPS Clinical Pharmacist 12/10/2017,8:39 AM

## 2017-12-10 NOTE — Progress Notes (Signed)
*  PRELIMINARY RESULTS* Echocardiogram 2D Echocardiogram has been performed.  [name] 12/10/2017, 10:39 AM

## 2017-12-11 ENCOUNTER — Inpatient Hospital Stay: Payer: No Typology Code available for payment source

## 2017-12-11 ENCOUNTER — Ambulatory Visit: Payer: No Typology Code available for payment source

## 2017-12-11 DIAGNOSIS — Z4502 Encounter for adjustment and management of automatic implantable cardiac defibrillator: Secondary | ICD-10-CM

## 2017-12-11 DIAGNOSIS — I5042 Chronic combined systolic (congestive) and diastolic (congestive) heart failure: Secondary | ICD-10-CM

## 2017-12-11 LAB — CBC
HEMATOCRIT: 31 % — AB (ref 39.0–52.0)
HEMOGLOBIN: 9.5 g/dL — AB (ref 13.0–17.0)
MCH: 29.9 pg (ref 26.0–34.0)
MCHC: 30.6 g/dL (ref 30.0–36.0)
MCV: 97.5 fL (ref 80.0–100.0)
NRBC: 0 % (ref 0.0–0.2)
PLATELETS: 100 10*3/uL — AB (ref 150–400)
RBC: 3.18 MIL/uL — AB (ref 4.22–5.81)
RDW: 14.4 % (ref 11.5–15.5)
WBC: 3.2 10*3/uL — ABNORMAL LOW (ref 4.0–10.5)

## 2017-12-11 LAB — COMPREHENSIVE METABOLIC PANEL
ALBUMIN: 2.8 g/dL — AB (ref 3.5–5.0)
ALT: 104 U/L — ABNORMAL HIGH (ref 0–44)
AST: 96 U/L — AB (ref 15–41)
Alkaline Phosphatase: 266 U/L — ABNORMAL HIGH (ref 38–126)
Anion gap: 5 (ref 5–15)
BILIRUBIN TOTAL: 1.6 mg/dL — AB (ref 0.3–1.2)
BUN: 28 mg/dL — AB (ref 8–23)
CO2: 28 mmol/L (ref 22–32)
Calcium: 8.4 mg/dL — ABNORMAL LOW (ref 8.9–10.3)
Chloride: 105 mmol/L (ref 98–111)
Creatinine, Ser: 2.38 mg/dL — ABNORMAL HIGH (ref 0.61–1.24)
GFR calc Af Amer: 29 mL/min — ABNORMAL LOW (ref >60)
GFR calc non Af Amer: 25 mL/min — ABNORMAL LOW (ref >60)
GLUCOSE: 121 mg/dL — AB (ref 70–99)
POTASSIUM: 4.1 mmol/L (ref 3.5–5.1)
Sodium: 138 mmol/L (ref 135–145)
TOTAL PROTEIN: 5.5 g/dL — AB (ref 6.5–8.1)

## 2017-12-11 LAB — APTT
aPTT: 81 seconds — ABNORMAL HIGH (ref 24–36)
aPTT: 93 seconds — ABNORMAL HIGH (ref 24–36)

## 2017-12-11 LAB — HEPARIN LEVEL (UNFRACTIONATED): Heparin Unfractionated: 0.44 IU/mL (ref 0.30–0.70)

## 2017-12-11 MED ORDER — TECHNETIUM TC 99M MEBROFENIN IV KIT
7.5000 | PACK | Freq: Once | INTRAVENOUS | Status: AC | PRN
  Administered 2017-12-11: 7.91 via INTRAVENOUS

## 2017-12-11 MED ORDER — OXYCODONE HCL 5 MG PO TABS
2.5000 mg | ORAL_TABLET | Freq: Four times a day (QID) | ORAL | Status: DC | PRN
  Administered 2017-12-11 – 2017-12-12 (×2): 2.5 mg via ORAL
  Filled 2017-12-11 (×3): qty 1

## 2017-12-11 MED ORDER — SODIUM CHLORIDE 0.9% FLUSH
3.0000 mL | Freq: Two times a day (BID) | INTRAVENOUS | Status: DC
  Administered 2017-12-11 – 2017-12-14 (×5): 3 mL via INTRAVENOUS

## 2017-12-11 MED ORDER — MORPHINE SULFATE (PF) 4 MG/ML IV SOLN
3.0000 mg | Freq: Once | INTRAVENOUS | Status: AC
  Administered 2017-12-11: 3 mg via INTRAVENOUS
  Filled 2017-12-11: qty 1

## 2017-12-11 NOTE — Progress Notes (Signed)
ANTICOAGULATION CONSULT NOTE - Initial Consult  Pharmacy Consult for heparin drip Indication: atrial fibrillation  Allergies  Allergen Reactions  . Dofetilide Other (See Comments)    Reaction: Syncope  . Amiodarone Other (See Comments)    Reaction: weakness    Patient Measurements: Height: 6' (182.9 cm) Weight: 163 lb 12.8 oz (74.3 kg) IBW/kg (Calculated) : 77.6 Heparin Dosing Weight: 79 kg  Vital Signs: Temp: 97.8 F (36.6 C) (11/03 1939) Temp Source: Oral (11/03 1939) BP: 100/62 (11/03 1939) Pulse Rate: 61 (11/03 1939)  Labs: Recent Labs    12/09/17 1726  12/10/17 0042 12/10/17 0657 12/10/17 1232 12/10/17 1622 12/10/17 2347  HGB 10.8*  --   --  9.2*  --   --   --   HCT 34.4*  --   --  29.4*  --   --   --   PLT 116*  --   --  94*  --   --   --   APTT  --    < > 38* >160*  --  140* 93*  LABPROT  --   --  19.3*  --   --   --   --   INR  --   --  1.65  --   --   --   --   HEPARINUNFRC  --   --  1.09*  --   --   --   --   CREATININE 2.40*  --   --  2.44*  --   --   --   TROPONINI 0.03*  --  0.14* 0.11* 0.09*  --   --    < > = values in this interval not displayed.    Estimated Creatinine Clearance: 27.1 mL/min (A) (by C-G formula based on SCr of 2.44 mg/dL (H)).   Medical History: Past Medical History:  Diagnosis Date  . AAA (abdominal aortic aneurysm) ([place])   . Cancer ([place])   . CHF (congestive heart failure) ([place])   . COPD (chronic obstructive pulmonary disease) ([place])   . GERD (gastroesophageal reflux disease)   . HTN (hypertension)   . Hx of CABG   . Lung cancer ([place])     Medications:  Rivaroxaban as outpatient  Assessment: Anti-Xa 1.09 on admission.  Goal of Therapy:  Heparin level 0.3-0.7 units/ml Monitor platelets by anticoagulation protocol: Yes   Plan:  11/3 06:57 aPTT >160. Hold infusion x 1 hour then resume at 1000 units/hr. Will recheck aPTT 6 hours after infusion starts.  11/3 1622 aPTT = 140 Will decrease heparin drip rate to 850  units/hr and recheck aPTT 6 hrs after rate change.   11/4 0000 aPTT 93. Continue current regimen. Recheck aPTT, CBC, and heparin level with tomorrow AM labs.  Makiya Jeune S, Pharm.D. Clinical Pharmacist 12/11/2017,1:29 AM

## 2017-12-11 NOTE — Progress Notes (Signed)
ANTICOAGULATION CONSULT NOTE - Initial Consult  Pharmacy Consult for heparin drip Indication: atrial fibrillation  Allergies  Allergen Reactions  . Dofetilide Other (See Comments)    Reaction: Syncope  . Amiodarone Other (See Comments)    Reaction: weakness    Patient Measurements: Height: 6' (182.9 cm) Weight: 166 lb 11.2 oz (75.6 kg) IBW/kg (Calculated) : 77.6 Heparin Dosing Weight: 79 kg  Vital Signs: Temp: 97.7 F (36.5 C) (11/04 0546) Temp Source: Oral (11/04 0546) BP: 110/66 (11/04 0546) Pulse Rate: 60 (11/04 0546)  Labs: Recent Labs    12/09/17 1726  12/10/17 0042 12/10/17 0657 12/10/17 1232 12/10/17 1622 12/10/17 2347 12/11/17 0508  HGB 10.8*  --   --  9.2*  --   --   --   --   HCT 34.4*  --   --  29.4*  --   --   --   --   PLT 116*  --   --  94*  --   --   --   --   APTT  --    < > 38* >160*  --  140* 93* 81*  LABPROT  --   --  19.3*  --   --   --   --   --   INR  --   --  1.65  --   --   --   --   --   HEPARINUNFRC  --   --  1.09*  --   --   --   --  0.44  CREATININE 2.40*  --   --  2.44*  --   --   --   --   TROPONINI 0.03*  --  0.14* 0.11* 0.09*  --   --   --    < > = values in this interval not displayed.    Estimated Creatinine Clearance: 27.5 mL/min (A) (by C-G formula based on SCr of 2.44 mg/dL (H)).   Medical History: Past Medical History:  Diagnosis Date  . AAA (abdominal aortic aneurysm) ([place])   . Cancer ([place])   . CHF (congestive heart failure) ([place])   . COPD (chronic obstructive pulmonary disease) ([place])   . GERD (gastroesophageal reflux disease)   . HTN (hypertension)   . Hx of CABG   . Lung cancer ([place])     Medications:  Rivaroxaban as outpatient  Assessment: Anti-Xa 1.09 on admission.  Goal of Therapy:  Heparin level 0.3-0.7 units/ml Monitor platelets by anticoagulation protocol: Yes   Plan:  11/3 06:57 aPTT >160. Hold infusion x 1 hour then resume at 1000 units/hr. Will recheck aPTT 6 hours after infusion  starts.  11/3 1622 aPTT = 140 Will decrease heparin drip rate to 850 units/hr and recheck aPTT 6 hrs after rate change.   11/4 0000 aPTT 93. Continue current regimen. Recheck aPTT, CBC, and heparin level with tomorrow AM labs.  11/4 AM aPTT 81, heparin level 0.44. Now correlating in therapeutic range. Will monitor and adjust by HL. Continue current regimen. Recheck heparin level and CBC with tomorrow AM labs.  Areej Tayler S, Pharm.D. Clinical Pharmacist 12/11/2017,6:03 AM

## 2017-12-11 NOTE — Progress Notes (Signed)
[facility] [place] NAME: [name]    MR#:  [id number]  DATE OF BIRTH:  09-11-1940  SUBJECTIVE:  CHIEF COMPLAINT:   Chief Complaint  Patient presents with  . Nausea   Had nausea and vomiting and came to emergency room.  Also his AICD fired because of arrhythmia.  Currently no nausea and vomiting and heart rate is under control.  On heparin drip in anticipation of requiring some procedures while holding the Xarelto. No complaints and tolerating diet fine. REVIEW OF SYSTEMS:  CONSTITUTIONAL: No fever, fatigue or weakness.  EYES: No blurred or double vision.  EARS, NOSE, AND THROAT: No tinnitus or ear pain.  RESPIRATORY: No cough, shortness of breath, wheezing or hemoptysis.  CARDIOVASCULAR: No chest pain, orthopnea, edema.  GASTROINTESTINAL: No nausea, vomiting, diarrhea or abdominal pain.  GENITOURINARY: No dysuria, hematuria.  ENDOCRINE: No polyuria, nocturia,  HEMATOLOGY: No anemia, easy bruising or bleeding SKIN: No rash or lesion. MUSCULOSKELETAL: No joint pain or arthritis.   NEUROLOGIC: No tingling, numbness, weakness.  PSYCHIATRY: No anxiety or depression.   ROS  DRUG ALLERGIES:   Allergies  Allergen Reactions  . Dofetilide Other (See Comments)    Reaction: Syncope  . Amiodarone Other (See Comments)    Reaction: weakness    VITALS:  Blood pressure 111/69, pulse 63, temperature 98.1 F (36.7 C), temperature source Oral, resp. rate 16, height 6' (1.829 m), weight 75.6 kg, SpO2 97 %.  PHYSICAL EXAMINATION:  GENERAL:  77 y.o.-year-old patient lying in the bed with no acute distress.  EYES: Pupils equal, round, reactive to light and accommodation. No scleral icterus. Extraocular muscles intact.  HEENT: Head atraumatic, normocephalic. Oropharynx and nasopharynx clear.  NECK:  Supple, no jugular venous distention. No thyroid enlargement, no tenderness.  LUNGS: Normal breath sounds bilaterally, no wheezing, rales,rhonchi or  crepitation. No use of accessory muscles of respiration.  CARDIOVASCULAR: S1, S2 normal. No murmurs, rubs, or gallops. AICD in place. ABDOMEN: Soft, nontender, nondistended. Bowel sounds present. No organomegaly or mass.  EXTREMITIES: No pedal edema, cyanosis, or clubbing.  NEUROLOGIC: Cranial nerves II through XII are intact. Muscle strength 5/5 in all extremities. Sensation intact. Gait not checked.  PSYCHIATRIC: The patient is alert and oriented x 3.  SKIN: No obvious rash, lesion, or ulcer.   Physical Exam LABORATORY PANEL:   CBC Recent Labs  Lab 12/11/17 0508  WBC 3.2*  HGB 9.5*  HCT 31.0*  PLT 100*   ------------------------------------------------------------------------------------------------------------------  Chemistries  Recent Labs  Lab 12/09/17 1726  12/11/17 1441  NA 142   < > 138  K 3.5   < > 4.1  CL 105   < > 105  CO2 27   < > 28  GLUCOSE 174*   < > 121*  BUN 22   < > 28*  CREATININE 2.40*   < > 2.38*  CALCIUM 8.8*   < > 8.4*  MG 1.3*  --   --   AST 227*  --  96*  ALT 70*  --  104*  ALKPHOS 373*  --  266*  BILITOT 3.4*  --  1.6*   < > = values in this interval not displayed.   ------------------------------------------------------------------------------------------------------------------  Cardiac Enzymes Recent Labs  Lab 12/10/17 0657 12/10/17 1232  TROPONINI 0.11* 0.09*   ------------------------------------------------------------------------------------------------------------------  RADIOLOGY:  Nm Hepatobiliary Liver Func  Result Date: 12/11/2017 CLINICAL DATA:  Left upper quadrant pain traveling up to right upper quadrant. Cholelithiasis/sludge on recent ultrasound.  EXAM: NUCLEAR MEDICINE HEPATOBILIARY IMAGING TECHNIQUE: Sequential images of the abdomen were obtained out to 60 minutes following intravenous administration of radiopharmaceutical. RADIOPHARMACEUTICALS:  7.91 mCi Tc-48m  Choletec IV COMPARISON:  None. FINDINGS: Prompt uptake  and biliary excretion of activity by the liver is seen. Gallbladder activity is visualized at 50-60 minutes, consistent with patency of cystic duct. Biliary to bowel transit of radiotracer is not well visualized which may be within normal, although can be seen with common bile duct obstruction or due to morphine effect. Note that 3 mg of morphine was given after 1 hour of imaging due to questionable visualization of the gallbladder. 2 hour images confirm gallbladder visualization which was first visualized at 50-60 minutes as described above. IMPRESSION: No evidence of acute cholecystitis. No definite visualization of biliary to bowel transit of radiotracer which may be within normal although can be seen with common duct obstruction or effect of morphine administration. Electronically Signed   By: Marin Olp M.D.   On: 12/11/2017 10:10   Dg Chest Portable 1 View  Result Date: 12/09/2017 CLINICAL DATA:  Nausea, vomiting, and abdominal pain. EXAM: PORTABLE CHEST 1 VIEW COMPARISON:  February 27, 2013 FINDINGS: A calcified nodule in the right base remains. No pneumothorax. No uncalcified nodules or masses. No suspicious infiltrates. Cardiomegaly. Stable AICD device. No other acute abnormalities. IMPRESSION: No active disease. Electronically Signed   By: Dorise Bullion III M.D   On: 12/09/2017 18:48   US Abdomen Limited Ruq  Result Date: 12/09/2017 CLINICAL DATA:  Transaminitis with abdominal pain. Nausea and vomiting for 1 hour. History of lung cancer. EXAM: ULTRASOUND ABDOMEN LIMITED RIGHT UPPER QUADRANT COMPARISON:  None. FINDINGS: Gallbladder: There are several non mobile stones near the neck of the gallbladder. The gallbladder wall is borderline to mildly thickened measuring 3.4 mm. No Murphy's sign or pericholecystic fluid. Gallbladder sludge noted. Common bile duct: Diameter: 7.4 mm Liver: There is a hyperechoic mass in the left hepatic lobe measuring 2.9 x 2.6 x 2.4 cm. Intrahepatic ductal dilatation  is noted. Other: Right pleural effusion. Portal vein is patent on color Doppler imaging with normal direction of blood flow towards the liver. IMPRESSION: 1. Non mobile stones near the neck of the gallbladder. The gallbladder wall is borderline to mildly thickened. Sludge. No Murphy's sign or pericholecystic fluid. A HIDA scan could further evaluate if there is continued concern for acute cholecystitis. 2. The common bile duct is mildly dilated measuring 7.4 mm. There is also some intrahepatic ductal dilatation. Recommend correlation with labs. An MRCP could better evaluate the intra and extrahepatic bile ducts. 3. There is a hyperechoic mass in the left hepatic lobe measuring up to 2.9 cm. While a hemangioma is possible, the mass is nonspecific and neoplasm/malignancy not excluded. Recommend an MRI of this mass to further characterize. The MRI of this mass could occur as an outpatient. 4. Right pleural effusion. Electronically Signed   By: Dorise Bullion III M.D   On: 12/09/2017 20:00    ASSESSMENT AND PLAN:   Principal Problem:   Defibrillator discharge Active Problems:   CAD (coronary artery disease)   HTN (hypertension)   GERD (gastroesophageal reflux disease)   COPD (chronic obstructive pulmonary disease) (HCC)   Chronic combined systolic and diastolic CHF (congestive heart failure) ([place])   Defibrillator discharge: AICD shocked patient in ED once, pacemaker interrogated by [employer].  Current rhythm A-fib, rate controlled post Amio bolus. Patient admitted to Telemetry with continuous cardiac monitoring, Cardiology consulted, echocardiogram ordered.  Cardio suggest  no medication change but continue with transferring to [place].    Cholelithiasis: HIDA scan ordered, based on results with consult surgery versus GI.  Patient states he has been told not to "let anyone put me to sleep". Can not be done on Sunday, started on diet, tolerating well.    Atrial Fibrillation: Patient Xarelto discontinued  due to reduced GFR and anticipation of procedure, pharmacy consulted for heparin gtt.  Home antiarrhythmic Continued.   Acute on Chronic Kidney Injury: Likely related to cardiorenal syndrome, though some dehydration given his recent vomiting could be a contributing factor.  We will not administer IVF due to his historically very low EF. Avoid nephrotoxins and monitor.    CAD (coronary artery disease): home medications continued    HTN (hypertension): home medications continued    COPD (chronic obstructive pulmonary disease) ([place]): home medications continued    Chronic combined systolic and diastolic CHF (congestive heart failure) ([place]): home medications continued    GERD (gastroesophageal reflux disease): PPI ordered, zofran PRN given   All the records are reviewed and case discussed with Care Management/Social Workerr. Management plans discussed with the patient, family and they are in agreement.  CODE STATUS: full.  TOTAL TIME TAKING CARE OF THIS PATIENT: 40 minutes.     POSSIBLE D/C IN 1-2 DAYS, DEPENDING ON CLINICAL CONDITION.   Vaughan Basta M.D on 12/11/2017   Between 7am to 6pm - Pager - [phone]  After 6pm go to [email or web address] - password [email or web address] [facility]  Office  [phone]  CC: Primary care physician; Burnell Blanks, MD  Note: This dictation was prepared with Dragon dictation along with smaller phrase technology. Any transcriptional errors that result from this process are unintentional.

## 2017-12-11 NOTE — Progress Notes (Signed)
Patient off unit for imaging. Will resume care upon return. [name] [facility]

## 2017-12-11 NOTE — Care Management (Signed)
RNCM has left 2 messages today with the [facility] for patient's request to transfer.  As of now the [place] has not returned call.

## 2017-12-11 NOTE — Plan of Care (Signed)
  Problem: Health Behavior/Discharge Planning: Goal: Ability to manage health-related needs will improve Outcome: Progressing Note:  [facility]. Case manager has entered request for transfer. Physician stated in person he would call facility later and request transfer. Will continue to monitor for possible transfer approval. Daron Offer

## 2017-12-11 NOTE — Progress Notes (Signed)
Progress Note  Patient Name: [name] Date of Encounter: 12/11/2017  Primary Cardiologist: [facility], Dr. Fletcher Anon  Subjective   Patient denies CP, palpitations, or racing HR. He reports racing HR earlier in the ED.  He states that he has a h/o permanent Afib on Xarelto. He reportedly does not tolerate amiodarone d/t leg weakness, stating "my legs get weak like water."  H/o orthostatic hypotension. He has not yet ambulated today. He denies near syncope sx.  He is concerned about his lung CA radiation treatment as he was reportedly scheduled for his very last treatment today but is now in the hospital. He also noted that he feels his heart skipping beats and racing after each radiation treatment. He reported his last treatment as last Wednesday 10/30.   Telemetry significant for bradycardic rates, IRIR, PVCs.   Current Vitals / Labs HR 67, RR 16, BP 114/74. No further ICD discharges this admission. Hgb 9.5 - monitor as on heparin. WBC 3.2 Cr 2.44 K 3.5, replete to 4.0 Mg 1.3. Replete to 1.3 Net -329.01 ml. On oral lasix  He is in the process of transfer to the [place].  Inpatient Medications    Scheduled Meds: . arformoterol  15 mcg Nebulization BID  . atorvastatin  20 mg Oral Daily  . finasteride  5 mg Oral Daily  . metoprolol succinate  37.5 mg Oral Daily  . mexiletine  150 mg Oral Q8H  . midodrine  5 mg Oral TID  . pantoprazole  40 mg Oral Daily  . potassium chloride  20 mEq Oral BID  . ramelteon  8 mg Oral QHS  . tamsulosin  0.4 mg Oral Daily  . umeclidinium bromide  1 puff Inhalation Daily   Continuous Infusions: . heparin 850 Units/hr (12/10/17 1751)   PRN Meds: acetaminophen **OR** acetaminophen, furosemide, ondansetron **OR** ondansetron (ZOFRAN) IV   Vital Signs    Vitals:   12/10/17 1939 12/10/17 2115 12/11/17 0546 12/11/17 0549  BP: 100/62  110/66   Pulse: 61  60   Resp: 16  16   Temp: 97.8 F (36.6 C)  97.7 F (36.5 C)   TempSrc: Oral  Oral     SpO2: 97% 95% 96%   Weight:    75.6 kg  Height:        Intake/Output Summary (Last 24 hours) at 12/11/2017 0925 Last data filed at 12/11/2017 0500 Gross per 24 hour  Intake 170.99 ml  Output 500 ml  Net -329.01 ml   Filed Weights   12/09/17 1721 12/10/17 0300 12/11/17 0549  Weight: 79.4 kg 74.3 kg 75.6 kg    Telemetry    IRIR, bradycardic with HR 40-60s Afib and Aflutter with variable block, PVCs - Personally Reviewed  ECG   No new tracings  Physical Exam   GEN: Elderly male, No acute distress.   Neck: No JVD Cardiac: IRIR, 3/6 holosystolic murmur at LV apex, rubs, or gallops.  Respiratory: Clear to auscultation bilaterally. GI: Soft, nontender, non-distended  MS: No edema; No deformity. Neuro:  Nonfocal  Psych: Normal affect   Labs    Chemistry Recent Labs  Lab 12/09/17 1726 12/10/17 0657  NA 142 139  K 3.5 3.5  CL 105 102  CO2 27 27  GLUCOSE 174* 122*  BUN 22 25*  CREATININE 2.40* 2.44*  CALCIUM 8.8* 8.3*  PROT 6.8  --   ALBUMIN 3.7  --   AST 227*  --   ALT 70*  --   ALKPHOS  373*  --   BILITOT 3.4*  --   GFRNONAA 25* 24*  GFRAA 29* 28*  ANIONGAP 10 10     Hematology Recent Labs  Lab 12/09/17 1726 12/10/17 0657 12/11/17 0508  WBC 2.5* 4.8 3.2*  RBC 3.57* 3.04* 3.18*  HGB 10.8* 9.2* 9.5*  HCT 34.4* 29.4* 31.0*  MCV 96.4 96.7 97.5  MCH 30.3 30.3 29.9  MCHC 31.4 31.3 30.6  RDW 14.4 14.7 14.4  PLT 116* 94* 100*    Cardiac Enzymes Recent Labs  Lab 12/09/17 1726 12/10/17 0042 12/10/17 0657 12/10/17 1232  TROPONINI 0.03* 0.14* 0.11* 0.09*   No results for input(s): TROPIPOC in the last 168 hours.   BNPNo results for input(s): BNP, PROBNP in the last 168 hours.   DDimer No results for input(s): DDIMER in the last 168 hours.   Radiology    Dg Chest Portable 1 View  Result Date: 12/09/2017 CLINICAL DATA:  Nausea, vomiting, and abdominal pain. EXAM: PORTABLE CHEST 1 VIEW COMPARISON:  February 27, 2013 FINDINGS: A calcified nodule  in the right base remains. No pneumothorax. No uncalcified nodules or masses. No suspicious infiltrates. Cardiomegaly. Stable AICD device. No other acute abnormalities. IMPRESSION: No active disease. Electronically Signed   By: Dorise Bullion III M.D   On: 12/09/2017 18:48   US Abdomen Limited Ruq  Result Date: 12/09/2017 CLINICAL DATA:  Transaminitis with abdominal pain. Nausea and vomiting for 1 hour. History of lung cancer. EXAM: ULTRASOUND ABDOMEN LIMITED RIGHT UPPER QUADRANT COMPARISON:  None. FINDINGS: Gallbladder: There are several non mobile stones near the neck of the gallbladder. The gallbladder wall is borderline to mildly thickened measuring 3.4 mm. No Murphy's sign or pericholecystic fluid. Gallbladder sludge noted. Common bile duct: Diameter: 7.4 mm Liver: There is a hyperechoic mass in the left hepatic lobe measuring 2.9 x 2.6 x 2.4 cm. Intrahepatic ductal dilatation is noted. Other: Right pleural effusion. Portal vein is patent on color Doppler imaging with normal direction of blood flow towards the liver. IMPRESSION: 1. Non mobile stones near the neck of the gallbladder. The gallbladder wall is borderline to mildly thickened. Sludge. No Murphy's sign or pericholecystic fluid. A HIDA scan could further evaluate if there is continued concern for acute cholecystitis. 2. The common bile duct is mildly dilated measuring 7.4 mm. There is also some intrahepatic ductal dilatation. Recommend correlation with labs. An MRCP could better evaluate the intra and extrahepatic bile ducts. 3. There is a hyperechoic mass in the left hepatic lobe measuring up to 2.9 cm. While a hemangioma is possible, the mass is nonspecific and neoplasm/malignancy not excluded. Recommend an MRI of this mass to further characterize. The MRI of this mass could occur as an outpatient. 4. Right pleural effusion. Electronically Signed   By: Dorise Bullion III M.D   On: 12/09/2017 20:00    Cardiac Studies   12/10/2017 TTE Left  ventricle: The cavity size was moderately dilated. Systolic   function was moderately to severely reduced. The estimated   ejection fraction was in the range of 30% to 35%. Diffuse   hypokinesis. Hypokinesis of the inferior myocardium. The study is   not technically sufficient to allow evaluation of LV diastolic   function. - Aortic valve: There was mild regurgitation. - Mitral valve: There was severe regurgitation directed   eccentrically and toward the free wall. - Left atrium: The atrium was severely dilated. - Right ventricle: Pacer wire or catheter noted in right ventricle.   Systolic function was mildly reduced. -  Right atrium: The atrium was moderately dilated. - Pulmonary arteries: Systolic pressure was severely elevated PA   peak pressure: 65 mm Hg (S).  Patient Profile     77 y.o. male with h/o VT s/p 12/10/17 discharge and intolerant to amiodarone, CAD s/p CABG and PCI, ICM and HFrEF, MR, orthostatic hypotension, CDK IV, COPD,   Assessment & Plan    1. VT s/p ICD discharge, H/o Afib  - ICD appropriately discharged in ED on 11/2 for VT x1 and 2 episodes of antitachycardia pacing per device interrogation - No reported CP. Did have some SOB earlier - H/o permanent Afib. On home Xarelto 15mg , currently held. Continue heparin for anticoagulation as does need anticoagulation in the setting of Afib. Telemetry shows Afib/Aflutter with bradycardia. - CHA2DS2VASc score of at least 3 (HFrEF, agex2), possibly higher as [facility] not [facility]  - Daily CBC as on heparin - Of note, he is intolerant to amiodarone as above  - Hold IV diuresis to avoid further hypokalemia, hypomagnesemia. Continue po lasix with electrolyte repletion as below. - 11/3 echo shows LVEF improved to 30-35% - Consider recheck Hgb A1C, TSH. Previous Hemoglobin A1C 5.7 in 2010 - Replete electrolytes - Elevation in troponin mild - peaked at 0.14, then trending down to 0.11 and 0.09. Troponin elevation likely 2/2 ICD  discharge with Cr 2.44.  - Requesting transfer to the [facility] for further management. Pending update regarding transfer   2. Hypokalemia, hypomagnesemia  - Replete with goal 4.0 - K 3.5 yesterday. Recommend repeat BMET today. - Replete with goal 2.0/ - Mg 1.3 on 11/2, Recheck.  3. Elevated troponin in setting of CAD history s/p CABG - S/p CABG and PCI with details not available as care via the [facility] - EKG showed Afib with non-specific IVCD. - No reported CP. No cardiac complaints at this time. - Flat and low troponin elevation with suspicion of demand ischemia rather than ACS. See #1 - Continue lipitor 20mg  daily, toprol xL 37.5mg  daily - Recommendation to hold IV lasix. Continue po lasix with repletion of electrolytes as above  4. Severe ICM; acute on chronic HFrEF - LVEF previously severely reduced at 10-15%   improving to 30-35% on 11/3 TTE - H/o inferior wall aneurysm - Echo as above  5. MR - 3/6 holosystolic murmur at LV apex - See echo above  6. H/o orthostatic hypotension - BP normotensive. No feelings of near syncope at this time. He has not ambulated.  - Tx with midodrine, mexiletine TID - continue  7. Early stage non-small cell lung cancer - Radiation therapy. Last treatment was scheduled for today - Will need rescheduled treatment, coordination  8. CKD IV - Cr elevated as above in #1. 2.44. Baseline unknown as last Cr was from 2015 - Caution with nephrotoxins, diuresis  9. COPD - Smoker with early stage non-small cell lung CA - O2 as needed, elevated PASP - Per IM  10. Bradycardia - Continue to monitor      For questions or updates, please contact [place] Please consult [email or web address] for contact info under        Signed, Arvil Chaco, PA-C  12/11/2017, 9:25 AM

## 2017-12-11 NOTE — Progress Notes (Signed)
[place] at [place] NAME: [name]    MR#:  [id number]  DATE OF BIRTH:  September 19, 1940  SUBJECTIVE:  CHIEF COMPLAINT:   Chief Complaint  Patient presents with  . Nausea   Had nausea and vomiting and came to emergency room.  Also his AICD fired because of arrhythmia.  Currently no nausea and vomiting and heart rate is under control.  On heparin drip in anticipation of requiring some procedures while holding the Xarelto. No complaints and tolerating diet fine. REVIEW OF SYSTEMS:  CONSTITUTIONAL: No fever, fatigue or weakness.  EYES: No blurred or double vision.  EARS, NOSE, AND THROAT: No tinnitus or ear pain.  RESPIRATORY: No cough, shortness of breath, wheezing or hemoptysis.  CARDIOVASCULAR: No chest pain, orthopnea, edema.  GASTROINTESTINAL: No nausea, vomiting, diarrhea or abdominal pain.  GENITOURINARY: No dysuria, hematuria.  ENDOCRINE: No polyuria, nocturia,  HEMATOLOGY: No anemia, easy bruising or bleeding SKIN: No rash or lesion. MUSCULOSKELETAL: No joint pain or arthritis.   NEUROLOGIC: No tingling, numbness, weakness.  PSYCHIATRY: No anxiety or depression.   ROS  DRUG ALLERGIES:   Allergies  Allergen Reactions  . Dofetilide Other (See Comments)    Reaction: Syncope  . Amiodarone Other (See Comments)    Reaction: weakness    VITALS:  Blood pressure 111/69, pulse 63, temperature 98.1 F (36.7 C), temperature source Oral, resp. rate 16, height 6' (1.829 m), weight 75.6 kg, SpO2 97 %.  PHYSICAL EXAMINATION:  GENERAL:  77 y.o.-year-old patient lying in the bed with no acute distress.  EYES: Pupils equal, round, reactive to light and accommodation. No scleral icterus. Extraocular muscles intact.  HEENT: Head atraumatic, normocephalic. Oropharynx and nasopharynx clear.  NECK:  Supple, no jugular venous distention. No thyroid enlargement, no tenderness.  LUNGS: Normal breath sounds bilaterally, no wheezing, rales,rhonchi or  crepitation. No use of accessory muscles of respiration.  CARDIOVASCULAR: S1, S2 normal. No murmurs, rubs, or gallops. AICD in place. ABDOMEN: Soft, nontender, nondistended. Bowel sounds present. No organomegaly or mass.  EXTREMITIES: No pedal edema, cyanosis, or clubbing.  NEUROLOGIC: Cranial nerves II through XII are intact. Muscle strength 5/5 in all extremities. Sensation intact. Gait not checked.  PSYCHIATRIC: The patient is alert and oriented x 3.  SKIN: No obvious rash, lesion, or ulcer.   Physical Exam LABORATORY PANEL:   CBC Recent Labs  Lab 12/11/17 0508  WBC 3.2*  HGB 9.5*  HCT 31.0*  PLT 100*   ------------------------------------------------------------------------------------------------------------------  Chemistries  Recent Labs  Lab 12/09/17 1726  12/11/17 1441  NA 142   < > 138  K 3.5   < > 4.1  CL 105   < > 105  CO2 27   < > 28  GLUCOSE 174*   < > 121*  BUN 22   < > 28*  CREATININE 2.40*   < > 2.38*  CALCIUM 8.8*   < > 8.4*  MG 1.3*  --   --   AST 227*  --  96*  ALT 70*  --  104*  ALKPHOS 373*  --  266*  BILITOT 3.4*  --  1.6*   < > = values in this interval not displayed.   ------------------------------------------------------------------------------------------------------------------  Cardiac Enzymes Recent Labs  Lab 12/10/17 0657 12/10/17 1232  TROPONINI 0.11* 0.09*   ------------------------------------------------------------------------------------------------------------------  RADIOLOGY:  Nm Hepatobiliary Liver Func  Result Date: 12/11/2017 CLINICAL DATA:  Left upper quadrant pain traveling up to right upper quadrant. Cholelithiasis/sludge on recent ultrasound.  EXAM: NUCLEAR MEDICINE HEPATOBILIARY IMAGING TECHNIQUE: Sequential images of the abdomen were obtained out to 60 minutes following intravenous administration of radiopharmaceutical. RADIOPHARMACEUTICALS:  7.91 mCi Tc-30m  Choletec IV COMPARISON:  None. FINDINGS: Prompt uptake  and biliary excretion of activity by the liver is seen. Gallbladder activity is visualized at 50-60 minutes, consistent with patency of cystic duct. Biliary to bowel transit of radiotracer is not well visualized which may be within normal, although can be seen with common bile duct obstruction or due to morphine effect. Note that 3 mg of morphine was given after 1 hour of imaging due to questionable visualization of the gallbladder. 2 hour images confirm gallbladder visualization which was first visualized at 50-60 minutes as described above. IMPRESSION: No evidence of acute cholecystitis. No definite visualization of biliary to bowel transit of radiotracer which may be within normal although can be seen with common duct obstruction or effect of morphine administration. Electronically Signed   By: Marin Olp M.D.   On: 12/11/2017 10:10   Dg Chest Portable 1 View  Result Date: 12/09/2017 CLINICAL DATA:  Nausea, vomiting, and abdominal pain. EXAM: PORTABLE CHEST 1 VIEW COMPARISON:  February 27, 2013 FINDINGS: A calcified nodule in the right base remains. No pneumothorax. No uncalcified nodules or masses. No suspicious infiltrates. Cardiomegaly. Stable AICD device. No other acute abnormalities. IMPRESSION: No active disease. Electronically Signed   By: Dorise Bullion III M.D   On: 12/09/2017 18:48   US Abdomen Limited Ruq  Result Date: 12/09/2017 CLINICAL DATA:  Transaminitis with abdominal pain. Nausea and vomiting for 1 hour. History of lung cancer. EXAM: ULTRASOUND ABDOMEN LIMITED RIGHT UPPER QUADRANT COMPARISON:  None. FINDINGS: Gallbladder: There are several non mobile stones near the neck of the gallbladder. The gallbladder wall is borderline to mildly thickened measuring 3.4 mm. No Murphy's sign or pericholecystic fluid. Gallbladder sludge noted. Common bile duct: Diameter: 7.4 mm Liver: There is a hyperechoic mass in the left hepatic lobe measuring 2.9 x 2.6 x 2.4 cm. Intrahepatic ductal dilatation  is noted. Other: Right pleural effusion. Portal vein is patent on color Doppler imaging with normal direction of blood flow towards the liver. IMPRESSION: 1. Non mobile stones near the neck of the gallbladder. The gallbladder wall is borderline to mildly thickened. Sludge. No Murphy's sign or pericholecystic fluid. A HIDA scan could further evaluate if there is continued concern for acute cholecystitis. 2. The common bile duct is mildly dilated measuring 7.4 mm. There is also some intrahepatic ductal dilatation. Recommend correlation with labs. An MRCP could better evaluate the intra and extrahepatic bile ducts. 3. There is a hyperechoic mass in the left hepatic lobe measuring up to 2.9 cm. While a hemangioma is possible, the mass is nonspecific and neoplasm/malignancy not excluded. Recommend an MRI of this mass to further characterize. The MRI of this mass could occur as an outpatient. 4. Right pleural effusion. Electronically Signed   By: Dorise Bullion III M.D   On: 12/09/2017 20:00    ASSESSMENT AND PLAN:   Principal Problem:   Defibrillator discharge Active Problems:   CAD (coronary artery disease)   HTN (hypertension)   GERD (gastroesophageal reflux disease)   COPD (chronic obstructive pulmonary disease) (HCC)   Chronic combined systolic and diastolic CHF (congestive heart failure) ([place])   Defibrillator discharge: AICD shocked patient in ED once, pacemaker interrogated by [employer].  Current rhythm A-fib, rate controlled post Amio bolus. Patient admitted to Telemetry with continuous cardiac monitoring, Cardiology consulted, echocardiogram ordered.  Cardio suggest  no medication change but continue with transferring to [place]. I received a call back from [place] hospitalist, suggested to monitor patient over here as he would be possibly discharged within the next 24 hours.  If condition changes and he ended up staying longer than we may need to call back to [facility] hospital.    Cholelithiasis: HIDA scan  ordered, based on results with consult surgery versus GI.  Patient states he has been told not to "let anyone put me to sleep". HIDA scan is done and does not show acute cholecystitis. Patient's LFT and bilirubin is improving.  And he is tolerating diet without any difficulties or nausea. He would not need any further interventions for this.    Atrial Fibrillation: Patient Xarelto discontinued due to reduced GFR and anticipation of procedure, pharmacy consulted for heparin gtt.  Home antiarrhythmic Continued.   Acute on Chronic Kidney Injury: Likely related to cardiorenal syndrome, though some dehydration given his recent vomiting could be a contributing factor.  We will not administer IVF due to his historically very low EF. Avoid nephrotoxins and monitor.    CAD (coronary artery disease): home medications continued    HTN (hypertension): home medications continued    COPD (chronic obstructive pulmonary disease) ([place]): home medications continued    Chronic combined systolic and diastolic CHF (congestive heart failure) ([place]): home medications continued    GERD (gastroesophageal reflux disease): PPI ordered, zofran PRN given   All the records are reviewed and case discussed with Care Management/Social Workerr. Management plans discussed with the patient, family and they are in agreement.  CODE STATUS: full.  TOTAL TIME TAKING CARE OF THIS PATIENT: 40 minutes.     POSSIBLE D/C IN 1-2 DAYS, DEPENDING ON CLINICAL CONDITION.   Vaughan Basta M.D on 12/11/2017   Between 7am to 6pm - Pager - [phone]  After 6pm go to [email or web address] - password [email or web address] [facility]  Office  [phone]  CC: Primary care physician; Burnell Blanks, MD  Note: This dictation was prepared with Dragon dictation along with smaller phrase technology. Any transcriptional errors that result from this process are unintentional.

## 2017-12-12 ENCOUNTER — Ambulatory Visit
Admission: RE | Admit: 2017-12-12 | Discharge: 2017-12-12 | Disposition: A | Discharge status: Home / Self Care | Payer: No Typology Code available for payment source | Source: Ambulatory Visit | Attending: Radiation Oncology | Admitting: Radiation Oncology

## 2017-12-12 ENCOUNTER — Inpatient Hospital Stay: Payer: No Typology Code available for payment source

## 2017-12-12 DIAGNOSIS — I959 Hypotension, unspecified: Secondary | ICD-10-CM

## 2017-12-12 DIAGNOSIS — I5022 Chronic systolic (congestive) heart failure: Secondary | ICD-10-CM

## 2017-12-12 DIAGNOSIS — I255 Ischemic cardiomyopathy: Secondary | ICD-10-CM

## 2017-12-12 DIAGNOSIS — I4821 Permanent atrial fibrillation: Secondary | ICD-10-CM

## 2017-12-12 DIAGNOSIS — C3431 Malignant neoplasm of lower lobe, right bronchus or lung: Secondary | ICD-10-CM | POA: Insufficient documentation

## 2017-12-12 DIAGNOSIS — N184 Chronic kidney disease, stage 4 (severe): Secondary | ICD-10-CM

## 2017-12-12 DIAGNOSIS — Z510 Encounter for antineoplastic radiation therapy: Secondary | ICD-10-CM | POA: Insufficient documentation

## 2017-12-12 DIAGNOSIS — I25110 Atherosclerotic heart disease of native coronary artery with unstable angina pectoris: Secondary | ICD-10-CM

## 2017-12-12 LAB — COMPREHENSIVE METABOLIC PANEL
ALT: 81 U/L — ABNORMAL HIGH (ref 0–44)
AST: 60 U/L — AB (ref 15–41)
Albumin: 2.9 g/dL — ABNORMAL LOW (ref 3.5–5.0)
Alkaline Phosphatase: 242 U/L — ABNORMAL HIGH (ref 38–126)
Anion gap: 5 (ref 5–15)
BUN: 27 mg/dL — AB (ref 8–23)
CALCIUM: 8.6 mg/dL — AB (ref 8.9–10.3)
CO2: 30 mmol/L (ref 22–32)
CREATININE: 2.26 mg/dL — AB (ref 0.61–1.24)
Chloride: 105 mmol/L (ref 98–111)
GFR, EST AFRICAN AMERICAN: 31 mL/min — AB (ref >60)
GFR, EST NON AFRICAN AMERICAN: 26 mL/min — AB (ref >60)
GLUCOSE: 105 mg/dL — AB (ref 70–99)
Potassium: 4.3 mmol/L (ref 3.5–5.1)
Sodium: 140 mmol/L (ref 135–145)
TOTAL PROTEIN: 5.5 g/dL — AB (ref 6.5–8.1)
Total Bilirubin: 1.2 mg/dL (ref 0.3–1.2)

## 2017-12-12 LAB — CBC
HEMATOCRIT: 28.2 % — AB (ref 39.0–52.0)
Hemoglobin: 8.8 g/dL — ABNORMAL LOW (ref 13.0–17.0)
MCH: 30.3 pg (ref 26.0–34.0)
MCHC: 31.2 g/dL (ref 30.0–36.0)
MCV: 97.2 fL (ref 80.0–100.0)
PLATELETS: 100 10*3/uL — AB (ref 150–400)
RBC: 2.9 MIL/uL — ABNORMAL LOW (ref 4.22–5.81)
RDW: 14.4 % (ref 11.5–15.5)
WBC: 3.2 10*3/uL — ABNORMAL LOW (ref 4.0–10.5)
nRBC: 0 % (ref 0.0–0.2)

## 2017-12-12 LAB — MAGNESIUM: Magnesium: 2.3 mg/dL (ref 1.7–2.4)

## 2017-12-12 LAB — HEPARIN LEVEL (UNFRACTIONATED): Heparin Unfractionated: 0.11 IU/mL — ABNORMAL LOW (ref 0.30–0.70)

## 2017-12-12 MED ORDER — HEPARIN BOLUS VIA INFUSION
2000.0000 [IU] | Freq: Once | INTRAVENOUS | Status: AC
  Administered 2017-12-12: 2000 [IU] via INTRAVENOUS
  Filled 2017-12-12: qty 2000

## 2017-12-12 MED ORDER — RIVAROXABAN 15 MG PO TABS
15.0000 mg | ORAL_TABLET | Freq: Every day | ORAL | Status: DC
  Administered 2017-12-12: 15 mg via ORAL
  Filled 2017-12-12: qty 1

## 2017-12-12 MED ORDER — MORPHINE SULFATE (PF) 2 MG/ML IV SOLN
2.0000 mg | Freq: Once | INTRAVENOUS | Status: AC
  Administered 2017-12-12: 2 mg via INTRAVENOUS
  Filled 2017-12-12: qty 1

## 2017-12-12 MED ORDER — MELATONIN 5 MG PO TABS
10.0000 mg | ORAL_TABLET | Freq: Every day | ORAL | Status: DC
  Administered 2017-12-12 – 2017-12-13 (×2): 10 mg via ORAL
  Filled 2017-12-12 (×3): qty 2

## 2017-12-12 MED ORDER — ASPIRIN EC 81 MG PO TBEC
81.0000 mg | DELAYED_RELEASE_TABLET | Freq: Every day | ORAL | Status: DC
  Administered 2017-12-12 – 2017-12-13 (×2): 81 mg via ORAL
  Filled 2017-12-12 (×2): qty 1

## 2017-12-12 MED ORDER — OXYCODONE HCL 5 MG PO TABS
5.0000 mg | ORAL_TABLET | Freq: Four times a day (QID) | ORAL | Status: DC | PRN
  Administered 2017-12-12 – 2017-12-14 (×5): 5 mg via ORAL
  Filled 2017-12-12 (×5): qty 1

## 2017-12-12 NOTE — Progress Notes (Signed)
[place] for heparin Indication: atrial fibrillation  Allergies  Allergen Reactions  . Dofetilide Other (See Comments)    Reaction: Syncope  . Amiodarone Other (See Comments)    Reaction: weakness    Patient Measurements: Height: 6' (182.9 cm) Weight: 168 lb 4.8 oz (76.3 kg) IBW/kg (Calculated) : 77.6 Heparin Dosing Weight: 79.4 kg  Vital Signs: Temp: 98.3 F (36.8 C) (11/05 0414) Temp Source: Oral (11/05 0414) BP: 113/79 (11/05 0746) Pulse Rate: 64 (11/05 0746)  Labs: Recent Labs    12/10/17 0042 12/10/17 0657 12/10/17 1232 12/10/17 1622 12/10/17 2347 12/11/17 0508 12/11/17 1441 12/12/17 0412  HGB  --  9.2*  --   --   --  9.5*  --  8.8*  HCT  --  29.4*  --   --   --  31.0*  --  28.2*  PLT  --  94*  --   --   --  100*  --  100*  APTT 38* >160*  --  140* 93* 81*  --   --   LABPROT 19.3*  --   --   --   --   --   --   --   INR 1.65  --   --   --   --   --   --   --   HEPARINUNFRC 1.09*  --   --   --   --  0.44  --  0.11*  CREATININE  --  2.44*  --   --   --   --  2.38* 2.26*  TROPONINI 0.14* 0.11* 0.09*  --   --   --   --   --     Estimated Creatinine Clearance: 30 mL/min (A) (by C-G formula based on SCr of 2.26 mg/dL (H)).   Medical History: Past Medical History:  Diagnosis Date  . AAA (abdominal aortic aneurysm) ([place])   . Cancer ([place])   . CHF (congestive heart failure) ([place])   . COPD (chronic obstructive pulmonary disease) ([place])   . GERD (gastroesophageal reflux disease)   . HTN (hypertension)   . Hx of CABG   . Lung cancer [facility])     Assessment: 77 year old male with h/o afib on rivaroxaban 15 mg PTA. Rivaroxaban held on admission and heparin drip started for possible procedure and reduced renal function. Pharmacy consulted for transition back to rivaroxaban. Patient received dose of rivaroxaban this morning. Pharmacy now consulted for transition back to heparin drip in anticipation of possible procedure. Patient  to transfer to [facility].  Goal of Therapy:  Heparin level 0.3-0.7 units/ml Monitor platelets by anticoagulation protocol: Yes   Plan:  Heparin drip to start tomorrow morning approximately 24 hours from dose of rivaroxaban today. Pharmacy to follow up in the morning with labs and order heparin as appropriate. CBC ordered with morning labs.  Tawnya Crook, PharmD Pharmacy Resident  12/12/2017 4:00 PM

## 2017-12-12 NOTE — Progress Notes (Signed)
ANTICOAGULATION CONSULT NOTE - Initial Consult  Pharmacy Consult for heparin drip Indication: atrial fibrillation  Allergies  Allergen Reactions  . Dofetilide Other (See Comments)    Reaction: Syncope  . Amiodarone Other (See Comments)    Reaction: weakness    Patient Measurements: Height: 6' (182.9 cm) Weight: 168 lb 4.8 oz (76.3 kg) IBW/kg (Calculated) : 77.6 Heparin Dosing Weight: 79 kg  Vital Signs: Temp: 98.3 F (36.8 C) (11/05 0414) Temp Source: Oral (11/05 0414) BP: 110/74 (11/05 0414) Pulse Rate: 65 (11/05 0414)  Labs: Recent Labs    12/10/17 0042 12/10/17 0657 12/10/17 1232 12/10/17 1622 12/10/17 2347 12/11/17 0508 12/11/17 1441 12/12/17 0412  HGB  --  9.2*  --   --   --  9.5*  --  8.8*  HCT  --  29.4*  --   --   --  31.0*  --  28.2*  PLT  --  94*  --   --   --  100*  --  100*  APTT 38* >160*  --  140* 93* 81*  --   --   LABPROT 19.3*  --   --   --   --   --   --   --   INR 1.65  --   --   --   --   --   --   --   HEPARINUNFRC 1.09*  --   --   --   --  0.44  --  0.11*  CREATININE  --  2.44*  --   --   --   --  2.38* 2.26*  TROPONINI 0.14* 0.11* 0.09*  --   --   --   --   --     Estimated Creatinine Clearance: 30 mL/min (A) (by C-G formula based on SCr of 2.26 mg/dL (H)).   Medical History: Past Medical History:  Diagnosis Date  . AAA (abdominal aortic aneurysm) ([place])   . Cancer ([place])   . CHF (congestive heart failure) ([place])   . COPD (chronic obstructive pulmonary disease) ([place])   . GERD (gastroesophageal reflux disease)   . HTN (hypertension)   . Hx of CABG   . Lung cancer ([place])     Medications:  Rivaroxaban as outpatient  Assessment: Anti-Xa 1.09 on admission.  Goal of Therapy:  Heparin level 0.3-0.7 units/ml Monitor platelets by anticoagulation protocol: Yes   Plan:  11/05 @ 0400 HL 0.11 subtherapeutic. Will rebolus w/ heparin 2000 units IV x 1 and increase rate to 1000 units/hr and will recheck HL @ 1400 hgb trending down will  continue to monitor.  Tobie Lords, Pharm.D. Clinical Pharmacist 12/12/2017,6:49 AM

## 2017-12-12 NOTE — Progress Notes (Signed)
[place] at [place] NAME: [name]    MR#:  [id number]  DATE OF BIRTH:  1940-10-23  SUBJECTIVE:  CHIEF COMPLAINT:   Chief Complaint  Patient presents with  . Nausea   Had nausea and vomiting and came to emergency room.  Also his AICD fired because of arrhythmia.  Currently no nausea and vomiting and heart rate is under control.  On heparin drip in anticipation of requiring some procedures while holding the Xarelto. No complaints and tolerating diet fine. Restarted Xarelto today.  LFTs are better.  He had pain in his right thigh today. REVIEW OF SYSTEMS:  CONSTITUTIONAL: No fever, fatigue or weakness.  EYES: No blurred or double vision.  EARS, NOSE, AND THROAT: No tinnitus or ear pain.  RESPIRATORY: No cough, shortness of breath, wheezing or hemoptysis.  CARDIOVASCULAR: No chest pain, orthopnea, edema.  GASTROINTESTINAL: No nausea, vomiting, diarrhea or abdominal pain.  GENITOURINARY: No dysuria, hematuria.  ENDOCRINE: No polyuria, nocturia,  HEMATOLOGY: No anemia, easy bruising or bleeding SKIN: No rash or lesion. MUSCULOSKELETAL: No joint pain or arthritis.   NEUROLOGIC: No tingling, numbness, weakness.  PSYCHIATRY: No anxiety or depression.   ROS  DRUG ALLERGIES:   Allergies  Allergen Reactions  . Dofetilide Other (See Comments)    Reaction: Syncope  . Amiodarone Other (See Comments)    Reaction: weakness    VITALS:  Blood pressure 108/74, pulse (!) 59, temperature 98.3 F (36.8 C), temperature source Oral, resp. rate 18, height 6' (1.829 m), weight 76.3 kg, SpO2 95 %.  PHYSICAL EXAMINATION:  GENERAL:  77 y.o.-year-old patient lying in the bed with no acute distress.  EYES: Pupils equal, round, reactive to light and accommodation. No scleral icterus. Extraocular muscles intact.  HEENT: Head atraumatic, normocephalic. Oropharynx and nasopharynx clear.  NECK:  Supple, no jugular venous distention. No thyroid enlargement,  no tenderness.  LUNGS: Normal breath sounds bilaterally, no wheezing, rales,rhonchi or crepitation. No use of accessory muscles of respiration.  CARDIOVASCULAR: S1, S2 normal. No murmurs, rubs, or gallops. AICD in place. ABDOMEN: Soft, nontender, nondistended. Bowel sounds present. No organomegaly or mass.  EXTREMITIES: No pedal edema, cyanosis, or clubbing.  Tender on the right thigh on local pressure but able to move distal feet and knee. NEUROLOGIC: Cranial nerves II through XII are intact. Muscle strength 5/5 in all extremities. Sensation intact. Gait not checked.  PSYCHIATRIC: The patient is alert and oriented x 3.  SKIN: No obvious rash, lesion, or ulcer.   Physical Exam LABORATORY PANEL:   CBC Recent Labs  Lab 12/12/17 0412  WBC 3.2*  HGB 8.8*  HCT 28.2*  PLT 100*   ------------------------------------------------------------------------------------------------------------------  Chemistries  Recent Labs  Lab 12/12/17 0412  NA 140  K 4.3  CL 105  CO2 30  GLUCOSE 105*  BUN 27*  CREATININE 2.26*  CALCIUM 8.6*  MG 2.3  AST 60*  ALT 81*  ALKPHOS 242*  BILITOT 1.2   ------------------------------------------------------------------------------------------------------------------  Cardiac Enzymes Recent Labs  Lab 12/10/17 0657 12/10/17 1232  TROPONINI 0.11* 0.09*   ------------------------------------------------------------------------------------------------------------------  RADIOLOGY:  Nm Hepatobiliary Liver Func  Result Date: 12/11/2017 CLINICAL DATA:  Left upper quadrant pain traveling up to right upper quadrant. Cholelithiasis/sludge on recent ultrasound. EXAM: NUCLEAR MEDICINE HEPATOBILIARY IMAGING TECHNIQUE: Sequential images of the abdomen were obtained out to 60 minutes following intravenous administration of radiopharmaceutical. RADIOPHARMACEUTICALS:  7.91 mCi Tc-14m  Choletec IV COMPARISON:  None. FINDINGS: Prompt uptake and biliary excretion of  activity by  the liver is seen. Gallbladder activity is visualized at 50-60 minutes, consistent with patency of cystic duct. Biliary to bowel transit of radiotracer is not well visualized which may be within normal, although can be seen with common bile duct obstruction or due to morphine effect. Note that 3 mg of morphine was given after 1 hour of imaging due to questionable visualization of the gallbladder. 2 hour images confirm gallbladder visualization which was first visualized at 50-60 minutes as described above. IMPRESSION: No evidence of acute cholecystitis. No definite visualization of biliary to bowel transit of radiotracer which may be within normal although can be seen with common duct obstruction or effect of morphine administration. Electronically Signed   By: Marin Olp M.D.   On: 12/11/2017 10:10   Dg Hip Unilat With Pelvis 2-3 Views Right  Result Date: 12/12/2017 CLINICAL DATA:  Onset of right hip pain today with no known injury. History of CHF and coronary artery disease and lung malignancy. EXAM: DG HIP (WITH OR WITHOUT PELVIS) 2-3V RIGHT COMPARISON:  None. FINDINGS: The bony pelvis is subjectively mildly osteopenic. There is no lytic nor blastic lesion. There are degenerative changes of the lower lumbar disc levels. AP and lateral views of the right hip reveal moderate symmetric joint space loss. The articular surfaces of the right femoral head and acetabulum remains smoothly rounded. The femoral neck, intertrochanteric, and subtrochanteric regions are normal. 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 No acute bony abnormality. Electronically Signed   By: David  Martinique M.D.   On: 12/12/2017 13:24    ASSESSMENT AND PLAN:   Principal Problem:   Defibrillator discharge Active Problems:   CAD (coronary artery disease)   HTN (hypertension)   GERD (gastroesophageal reflux disease)   COPD (chronic obstructive pulmonary disease) (HCC)   Chronic combined  systolic and diastolic CHF (congestive heart failure) (HCC)   * Defibrillator discharge: AICD shocked patient in ED once, pacemaker interrogated by [employer].  Current rhythm A-fib, rate controlled post Amio bolus. Patient admitted to Telemetry with continuous cardiac monitoring, Cardiology consulted, echocardiogram ordered.  Cardio suggest no medication change but continue with transferring to [place]. Initially the plan was discharging home from here and not transferred to [place]. Cardiology strongly suggested to transfer to [facility] today as he would need interrogation and may be change in the setting of his pacemaker along with possible catheterization.  [facility] called again for need of transfer.  *   Cholelithiasis: HIDA scan ordered, based on results with consult surgery versus GI.   HIDA scan is done and does not show acute cholecystitis. Patient's LFT and bilirubin is improving.  And he is tolerating diet without any difficulties or nausea. He would not need any further interventions for this.   *  Chronic atrial Fibrillation: Patient Xarelto discontinued due to reduced GFR and anticipation of procedure, pharmacy consulted for heparin gtt.  Home antiarrhythmic Continued. Resume Xarelto.  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  We will not administer IVF due to his historically very low EF. Avoid nephrotoxins and monitor.   * CAD (coronary artery disease): home medications continued   * HTN (hypertension): home medications continued  *  COPD (chronic obstructive pulmonary disease) ([place]): home medications continued   * Chronic combined systolic and diastolic CHF (congestive heart failure) ([place]): home medications continued  *  GERD (gastroesophageal reflux disease):  PPI ordered, zofran PRN given   All the records are reviewed and case discussed with Care  Management/Social Workerr. Management plans discussed with the patient, family and they are in agreement.  CODE STATUS: full.  TOTAL TIME TAKING CARE OF THIS PATIENT: 40 minutes.     POSSIBLE D/C IN 1-2 DAYS, DEPENDING ON CLINICAL CONDITION.   Vaughan Basta M.D on 12/12/2017   Between 7am to 6pm - Pager - [phone]  After 6pm go to [email or web address] - password [email or web address] [facility]  Office  [phone]  CC: Primary care physician; Burnell Blanks, MD  Note: This dictation was prepared with Dragon dictation along with smaller phrase technology. Any transcriptional errors that result from this process are unintentional.

## 2017-12-12 NOTE — Progress Notes (Signed)
[place] for rivaroxaban Indication: atrial fibrillation  Allergies  Allergen Reactions  . Dofetilide Other (See Comments)    Reaction: Syncope  . Amiodarone Other (See Comments)    Reaction: weakness    Patient Measurements: Height: 6' (182.9 cm) Weight: 168 lb 4.8 oz (76.3 kg) IBW/kg (Calculated) : 77.6  Vital Signs: Temp: 98.3 F (36.8 C) (11/05 0414) Temp Source: Oral (11/05 0414) BP: 113/79 (11/05 0746) Pulse Rate: 64 (11/05 0746)  Labs: Recent Labs    12/10/17 0042 12/10/17 0657 12/10/17 1232 12/10/17 1622 12/10/17 2347 12/11/17 0508 12/11/17 1441 12/12/17 0412  HGB  --  9.2*  --   --   --  9.5*  --  8.8*  HCT  --  29.4*  --   --   --  31.0*  --  28.2*  PLT  --  94*  --   --   --  100*  --  100*  APTT 38* >160*  --  140* 93* 81*  --   --   LABPROT 19.3*  --   --   --   --   --   --   --   INR 1.65  --   --   --   --   --   --   --   HEPARINUNFRC 1.09*  --   --   --   --  0.44  --  0.11*  CREATININE  --  2.44*  --   --   --   --  2.38* 2.26*  TROPONINI 0.14* 0.11* 0.09*  --   --   --   --   --     Estimated Creatinine Clearance: 30 mL/min (A) (by C-G formula based on SCr of 2.26 mg/dL (H)).   Assessment: 77 year old male with h/o afib on rivaroxaban 15 mg PTA. Rivaroxaban held on admission and heparin drip started for possible procedure and reduced renal function. Pharmacy consulted for transition back to rivaroxaban.  Goal of Therapy:  Monitor platelets by anticoagulation protocol: Yes   Plan:  Will discontinue heparin drip and start rivaroxaban 15 mg daily with first dose to be given when heparin drip stopped. Scheduled for 0930 this morning. Will transition back to once daily with supper tomorrow if patient remains in hospital. If patient is discharged today, he will need to hold dose this evening and resume tomorrow evening.  Tawnya Crook, PharmD Pharmacy Resident  12/12/2017 9:09 AM

## 2017-12-12 NOTE — Care Management (Signed)
Spoke with Tanya at the [place].  RNCM is faxing latest progress note along with labs to [phone] for review.

## 2017-12-12 NOTE — Care Management Important Message (Signed)
Copy of signed IM left with patient in room.  

## 2017-12-12 NOTE — Progress Notes (Signed)
Progress Note  Patient Name: [name] Date of Encounter: 12/12/2017  Primary Cardiologist: [facility]  Subjective   Mr. Ross feels okay this morning but is still fatigued.  He reports an episode of chest tightness and pain radiating to the jaw earlier this morning, which lasted several minutes.  He was at rest at the time.  No shortness of breath at this time, though he notes dyspnea beginning last week with modest activity.  No further ICD shocks.  He continues to feel occasional brief palpitations.  Inpatient Medications    Scheduled Meds: . arformoterol  15 mcg Nebulization BID  . atorvastatin  20 mg Oral Daily  . finasteride  5 mg Oral Daily  . metoprolol succinate  37.5 mg Oral Daily  . mexiletine  150 mg Oral Q8H  . midodrine  5 mg Oral TID  . pantoprazole  40 mg Oral Daily  . potassium chloride  20 mEq Oral BID  . ramelteon  8 mg Oral QHS  . rivaroxaban  15 mg Oral Q supper  . sodium chloride flush  3 mL Intravenous Q12H  . tamsulosin  0.4 mg Oral Daily  . umeclidinium bromide  1 puff Inhalation Daily   Continuous Infusions:  PRN Meds: acetaminophen **OR** acetaminophen, furosemide, ondansetron **OR** ondansetron (ZOFRAN) IV, oxyCODONE   Vital Signs    Vitals:   12/12/17 0414 12/12/17 0450 12/12/17 0742 12/12/17 0746  BP: 110/74   113/79  Pulse: 65   64  Resp: 16     Temp: 98.3 F (36.8 C)     TempSrc: Oral     SpO2: 95%  98% 100%  Weight:  76.3 kg    Height:        Intake/Output Summary (Last 24 hours) at 12/12/2017 1244 Last data filed at 12/12/2017 1023 Gross per 24 hour  Intake 1637.54 ml  Output 990 ml  Net 647.54 ml   Filed Weights   12/10/17 0300 12/11/17 0549 12/12/17 0450  Weight: 74.3 kg 75.6 kg 76.3 kg    Telemetry    Atrial flutter with ventricular rates from 44 to 70 bpm - Personally Reviewed  ECG    No new tracing - Personally Reviewed  Physical Exam   GEN:  Chronically ill-appearing man, lying in bed. Neck: No  JVD Cardiac:  Irregularly irregular with 2/6 systolic murmur. Respiratory: Clear to auscultation bilaterally. GI: Soft, nontender, non-distended  MS: No edema; No deformity. Neuro:  Nonfocal  Psych: Normal affect   Labs    Chemistry Recent Labs  Lab 12/09/17 1726 12/10/17 0657 12/11/17 1441 12/12/17 0412  NA 142 139 138 140  K 3.5 3.5 4.1 4.3  CL 105 102 105 105  CO2 27 27 28 30   GLUCOSE 174* 122* 121* 105*  BUN 22 25* 28* 27*  CREATININE 2.40* 2.44* 2.38* 2.26*  CALCIUM 8.8* 8.3* 8.4* 8.6*  PROT 6.8  --  5.5* 5.5*  ALBUMIN 3.7  --  2.8* 2.9*  AST 227*  --  96* 60*  ALT 70*  --  104* 81*  ALKPHOS 373*  --  266* 242*  BILITOT 3.4*  --  1.6* 1.2  GFRNONAA 25* 24* 25* 26*  GFRAA 29* 28* 29* 31*  ANIONGAP 10 10 5 5      Hematology Recent Labs  Lab 12/10/17 0657 12/11/17 0508 12/12/17 0412  WBC 4.8 3.2* 3.2*  RBC 3.04* 3.18* 2.90*  HGB 9.2* 9.5* 8.8*  HCT 29.4* 31.0* 28.2*  MCV 96.7 97.5 97.2  MCH 30.3 29.9 30.3  MCHC 31.3 30.6 31.2  RDW 14.7 14.4 14.4  PLT 94* 100* 100*    Cardiac Enzymes Recent Labs  Lab 12/09/17 1726 12/10/17 0042 12/10/17 0657 12/10/17 1232  TROPONINI 0.03* 0.14* 0.11* 0.09*   No results for input(s): TROPIPOC in the last 168 hours.   BNPNo results for input(s): BNP, PROBNP in the last 168 hours.   DDimer No results for input(s): DDIMER in the last 168 hours.   Radiology    Nm Hepatobiliary Liver Func  Result Date: 12/11/2017 CLINICAL DATA:  Left upper quadrant pain traveling up to right upper quadrant. Cholelithiasis/sludge on recent ultrasound. EXAM: NUCLEAR MEDICINE HEPATOBILIARY IMAGING TECHNIQUE: Sequential images of the abdomen were obtained out to 60 minutes following intravenous administration of radiopharmaceutical. RADIOPHARMACEUTICALS:  7.91 mCi Tc-56m  Choletec IV COMPARISON:  None. FINDINGS: Prompt uptake and biliary excretion of activity by the liver is seen. Gallbladder activity is visualized at 50-60 minutes,  consistent with patency of cystic duct. Biliary to bowel transit of radiotracer is not well visualized which may be within normal, although can be seen with common bile duct obstruction or due to morphine effect. Note that 3 mg of morphine was given after 1 hour of imaging due to questionable visualization of the gallbladder. 2 hour images confirm gallbladder visualization which was first visualized at 50-60 minutes as described above. IMPRESSION: No evidence of acute cholecystitis. No definite visualization of biliary to bowel transit of radiotracer which may be within normal although can be seen with common duct obstruction or effect of morphine administration. Electronically Signed   By: Marin Olp M.D.   On: 12/11/2017 10:10    Cardiac Studies   Echo (12/10/17): - Left ventricle: The cavity size was moderately dilated. Systolic   function was moderately to severely reduced. The estimated   ejection fraction was in the range of 30% to 35%. Diffuse   hypokinesis. Hypokinesis of the inferior myocardium. The study is   not technically sufficient to allow evaluation of LV diastolic   function. - Aortic valve: There was mild regurgitation. - Mitral valve: There was severe regurgitation directed   eccentrically and toward the free wall. - Left atrium: The atrium was severely dilated. - Right ventricle: Pacer wire or catheter noted in right ventricle.   Systolic function was mildly reduced. - Right atrium: The atrium was moderately dilated. - Pulmonary arteries: Systolic pressure was severely elevated PA   peak pressure: 65 mm Hg (S).  Patient Profile     77 y.o. male with h/o VT s/p 12/10/17 discharge and intolerant to amiodarone, CAD s/p CABG and PCI, ICM and HFrEF, MR, orthostatic hypotension, CDK IV, COPD,  admitted after ICD therapy for ventricular tachycardia.  Assessment & Plan    Ventricular tachycardia No further recurrence since admission.  Patient remains on metoprolol and  mexiletine.  Up titration of metoprolol is limited by slow ventricular heart rates in the setting of atrial a relation/flutter.  Continue mexiletine 150 mg every 8 hours and metoprolol succinate 37.5 mg daily.  Given chest pain and history of CAD, there is certainly concern for ischemia or scar mediated VT.  Ideally, patient would undergo cardiac catheterization, though he is certainly at elevated risk for contrast-induced nephropathy in the setting of CKD stage IV.  Nonetheless, I think this needs to be considered.  Right heart catheterization at the same time to better assess his filling pressures would also be helpful.  Given the [facility], I recommend [facility] there for ongoing work-up.  Defer retrial of amiodarone, given history of intolerance in the past.  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  Maintain net even to slightly negative fluid balance.  Continue metoprolol succinate 37.5 mg daily.  Unable to add ACE inhibitor/ARB/aldosterone antagonist in the setting of advanced CKD.  Device interrogation personally reviewed.  Patient has a biventricular pacemaker but is ventricularly pacing less than 1% of the time.  I am uncertain as to why he is programmed with a base rate of 40 bpm.  Given his severely reduced LVEF, baseline left bundle branch block, and recurrent VT with inability to further escalate metoprolol due to low ventricular rates, increasing his pacing rate to optimize CRT would be advisable.  I will defer this to his cardiology team at the [facility].  Coronary artery disease Mr. Dougan reports an episode of chest pain this morning.  In the setting of recurrent VT requiring shocks 2 days ago, worsening coronary insufficiency is a possibility.  Mild troponin  elevation on admission is nonspecific in the setting of systolic heart failure and ICD shocks for ventricular tachycardia.  Start aspirin 81 mg daily.  Continue atorvastatin and metoprolol.  Permanent atrial fibrillation Relatively slow ventricular response noted.  Hold rivaroxaban, in case invasive procedures such as cardiac catheterization are needed.  Initiate heparin infusion when next dose of rivaroxaban is due.  Hypotension Blood pressure relatively normal, with the patient currently on midodrine 5 mg 3 times daily with meals.  Continue close monitoring of blood pressure.  Recommend weaning/stopping midodrine, if possible, to minimize afterload in the setting of severe cardiomyopathy.  Chronic kidney disease stage IV Creatinine relatively stable.  Maintain net even to slightly negative fluid balance.  Avoid nephrotoxic drugs.  Non-small cell lung cancer  Ongoing treatment and management per internal medicine and oncology.  For questions or updates, please contact [place] Please consult [email or web address] for contact info under [facility] Card[facility].     Signed, Nelva Bush, MD  12/12/2017, 12:44 PM

## 2017-12-12 NOTE — Progress Notes (Signed)
Complaints of pain to right thigh and groin.  Reports pain started on Sunday and no relief obtain with Tylenol given on previous shift. Right leg without swelling or increased warth.  Patient lying in bed with leg flexed on bed and reports increased pain with extending leg flat on bed.  MD made aware.  OxyContin orders given.

## 2017-12-12 NOTE — Care Management (Signed)
[name] from the [place] called and confirmed fax received.  She is putting him in for review again.

## 2017-12-13 ENCOUNTER — Inpatient Hospital Stay: Payer: No Typology Code available for payment source

## 2017-12-13 ENCOUNTER — Ambulatory Visit: Payer: No Typology Code available for payment source

## 2017-12-13 DIAGNOSIS — R58 Hemorrhage, not elsewhere classified: Secondary | ICD-10-CM

## 2017-12-13 DIAGNOSIS — K8021 Calculus of gallbladder without cholecystitis with obstruction: Secondary | ICD-10-CM

## 2017-12-13 DIAGNOSIS — R55 Syncope and collapse: Secondary | ICD-10-CM

## 2017-12-13 DIAGNOSIS — R52 Pain, unspecified: Secondary | ICD-10-CM

## 2017-12-13 DIAGNOSIS — J432 Centrilobular emphysema: Secondary | ICD-10-CM

## 2017-12-13 DIAGNOSIS — R112 Nausea with vomiting, unspecified: Secondary | ICD-10-CM

## 2017-12-13 DIAGNOSIS — K661 Hemoperitoneum: Secondary | ICD-10-CM

## 2017-12-13 LAB — CBC
HCT: 25.8 % — ABNORMAL LOW (ref 39.0–52.0)
HEMATOCRIT: 29.4 % — AB (ref 39.0–52.0)
HEMOGLOBIN: 8.8 g/dL — AB (ref 13.0–17.0)
HEMOGLOBIN: 7.9 g/dL — AB (ref 13.0–17.0)
MCH: 29.8 pg (ref 26.0–34.0)
MCH: 30 pg (ref 26.0–34.0)
MCHC: 29.9 g/dL — AB (ref 30.0–36.0)
MCHC: 30.6 g/dL (ref 30.0–36.0)
MCV: 99.7 fL (ref 80.0–100.0)
MCV: 98.1 fL (ref 80.0–100.0)
Platelets: 111 10*3/uL — ABNORMAL LOW (ref 150–400)
Platelets: 108 10*3/uL — ABNORMAL LOW (ref 150–400)
RBC: 2.95 MIL/uL — ABNORMAL LOW (ref 4.22–5.81)
RBC: 2.63 MIL/uL — ABNORMAL LOW (ref 4.22–5.81)
RDW: 14.3 % (ref 11.5–15.5)
RDW: 14.3 % (ref 11.5–15.5)
WBC: 3.5 10*3/uL — ABNORMAL LOW (ref 4.0–10.5)
WBC: 3.7 10*3/uL — ABNORMAL LOW (ref 4.0–10.5)
nRBC: 0 % (ref 0.0–0.2)
nRBC: 0 % (ref 0.0–0.2)

## 2017-12-13 LAB — COMPREHENSIVE METABOLIC PANEL
ALBUMIN: 3.1 g/dL — AB (ref 3.5–5.0)
ALK PHOS: 226 U/L — AB (ref 38–126)
ALT: 62 U/L — AB (ref 0–44)
AST: 35 U/L (ref 15–41)
Anion gap: 6 (ref 5–15)
BILIRUBIN TOTAL: 1.2 mg/dL (ref 0.3–1.2)
BUN: 22 mg/dL (ref 8–23)
CALCIUM: 9.1 mg/dL (ref 8.9–10.3)
CO2: 28 mmol/L (ref 22–32)
CREATININE: 2.09 mg/dL — AB (ref 0.61–1.24)
Chloride: 107 mmol/L (ref 98–111)
GFR calc non Af Amer: 29 mL/min — ABNORMAL LOW (ref >60)
GFR, EST AFRICAN AMERICAN: 34 mL/min — AB (ref >60)
GLUCOSE: 98 mg/dL (ref 70–99)
Potassium: 5.1 mmol/L (ref 3.5–5.1)
Sodium: 141 mmol/L (ref 135–145)
TOTAL PROTEIN: 6 g/dL — AB (ref 6.5–8.1)

## 2017-12-13 LAB — APTT
APTT: 47 s — AB (ref 24–36)
aPTT: 43 seconds — ABNORMAL HIGH (ref 24–36)

## 2017-12-13 LAB — HEPARIN LEVEL (UNFRACTIONATED): Heparin Unfractionated: 2.2 IU/mL — ABNORMAL HIGH (ref 0.30–0.70)

## 2017-12-13 MED ORDER — HEPARIN (PORCINE) 25000 UT/250ML-% IV SOLN
1000.0000 [IU]/h | INTRAVENOUS | Status: DC
  Administered 2017-12-13: 1000 [IU]/h via INTRAVENOUS
  Filled 2017-12-13: qty 250

## 2017-12-13 MED ORDER — HYDROMORPHONE HCL 1 MG/ML IJ SOLN
1.0000 mg | Freq: Once | INTRAMUSCULAR | Status: AC
  Administered 2017-12-13: 1 mg via INTRAVENOUS
  Filled 2017-12-13: qty 1

## 2017-12-13 MED ORDER — HEPARIN BOLUS VIA INFUSION
3700.0000 [IU] | Freq: Once | INTRAVENOUS | Status: AC
  Administered 2017-12-13: 3700 [IU] via INTRAVENOUS
  Filled 2017-12-13: qty 3700

## 2017-12-13 MED ORDER — OXYCODONE HCL 5 MG PO TABS: 5.0000 mg | ORAL_TABLET | Freq: Four times a day (QID) | ORAL | 0 refills | Status: AC | PRN

## 2017-12-13 MED ORDER — HYDROMORPHONE HCL 1 MG/ML IJ SOLN
1.0000 mg | INTRAMUSCULAR | Status: DC | PRN
  Administered 2017-12-13 – 2017-12-14 (×3): 1 mg via INTRAVENOUS
  Filled 2017-12-13 (×3): qty 1

## 2017-12-13 NOTE — Consult Note (Signed)
Reason for Consult:Right lateral thigh pain and numbness Referring Physician: Doylene Canning.  CC: Right lateral thigh pain and numbness  HPI: [name] is an 77 y.o. male pertinent history of severe coronary artery disease with systolic failure status post AICD, orthostatic hypotension, atrial fibrillation, chronic renal insufficiency, ventral hernia, abdominal aortic aneurysm, COPD, hypertension, lung cancer, spontaneous bleed into the right calf and peripheral artery disease presenting to the ED on 12/09/2017 with chief complaints of nausea and vomiting, burning pain in epigastric region, shortness of breath, dizziness and near syncopal episode.  He also had an episode of his AICD firing due to arrhythmia.  He has since been restarted on Xarelto for atrial fibrillation.  During the course of his admission patient has complained of right thigh pain.  He reports that pain started last Sunday 12/09/2017 without warning. Describes symptoms of paresthesia  In his right proximal lateral thigh associated with burning and stinging pain. He also complains of right heel pain that seem to radiating to his upper right thigh with movement. Patient states that certain position such as prolonged sitting or squatting, walking, standing  dorsiflexion worsens symptoms.  Denies weakness, trauma, back pain, bladder or bowel dysfunction, and saddle anesthesia. However he states he has not been able to walk due to pain and discomfort. Patient had spontaneous bleeding into his right leg swelling and bruising which has since resolved after reduction of his anticoagulation in 05/2017. Denies recent trauma, injury or surgery to right leg.  Past Medical History:  Diagnosis Date  . AAA (abdominal aortic aneurysm) ([place])   . Cancer ([place])   . CHF (congestive heart failure) ([place])   . COPD (chronic obstructive pulmonary disease) ([place])   . GERD (gastroesophageal reflux disease)   . HTN (hypertension)   . Hx of CABG   . Lung cancer  [facility])     Past Surgical History:  Procedure Laterality Date  . CARDIAC DEFIBRILLATOR PLACEMENT    . CORONARY ARTERY BYPASS GRAFT      Family History  Problem Relation Age of Onset  . Heart disease Mother   . Diabetes Father   . COPD Sister   . COPD Brother     Social History:  reports that he has quit smoking. His smoking use included cigarettes. He quit after 15.00 years of use. He has quit using smokeless tobacco.  His smokeless tobacco use included snuff. He reports that he drank alcohol. He reports that he has current or past drug history.  Allergies  Allergen Reactions  . Dofetilide Other (See Comments)    Reaction: Syncope  . Amiodarone Other (See Comments)    Reaction: weakness    Medications:  I have reviewed the patient's current medications. Prior to Admission:  Medications Prior to Admission  Medication Sig Dispense Refill Last Dose  . atorvastatin (LIPITOR) 40 MG tablet Take 0.5 tablets by mouth at bedtime.    12/08/2017 at Unknown time  . finasteride (PROSCAR) 5 MG tablet Take 1 tablet by mouth daily.   12/08/2017 at Unknown time  . furosemide (LASIX) 20 MG tablet Take 20 mg by mouth 2 (two) times daily as needed for fluid.   prn at prn  . metoprolol succinate (TOPROL-XL) 25 MG 24 hr tablet Take 37.5 mg by mouth at bedtime.    12/08/2017 at Unknown time  . mexiletine (MEXITIL) 150 MG capsule Take 1 capsule by mouth 3 (three) times daily.    12/08/2017 at Unknown time  . midodrine (PROAMATINE) 5 MG tablet  Take 5 mg by mouth 3 (three) times daily.    12/08/2017 at Unknown time  . omeprazole (PRILOSEC) 20 MG capsule Take 1 capsule by mouth 2 (two) times daily.    12/08/2017 at Unknown time  . Rivaroxaban (XARELTO) 15 MG TABS tablet Take 15 mg by mouth daily.    12/08/2017 at Unknown time  . tamsulosin (FLOMAX) 0.4 MG CAPS capsule Take 1 capsule by mouth at bedtime.    12/08/2017 at Unknown time  . Tiotropium Bromide-Olodaterol (STIOLTO RESPIMAT) 2.5-2.5 MCG/ACT AERS Inhale  2 puffs into the lungs daily.   12/08/2017 at Unknown time  . nitroGLYCERIN (NITROSTAT) 0.4 MG SL tablet Place 0.4 mg under the tongue every 5 (five) minutes as needed for chest pain.      Scheduled: . arformoterol  15 mcg Nebulization BID  . aspirin EC  81 mg Oral Daily  . atorvastatin  20 mg Oral Daily  . finasteride  5 mg Oral Daily  . Melatonin  10 mg Oral QHS  . metoprolol succinate  37.5 mg Oral Daily  . mexiletine  150 mg Oral Q8H  . midodrine  5 mg Oral TID  . pantoprazole  40 mg Oral Daily  . sodium chloride flush  3 mL Intravenous Q12H  . tamsulosin  0.4 mg Oral Daily  . umeclidinium bromide  1 puff Inhalation Daily    ROS: History obtained from the patient   General ROS: negative for - chills, fatigue, fever, night sweats, weight gain or weight loss Psychological ROS: negative for - behavioral disorder, hallucinations, memory difficulties, mood swings or suicidal ideation Ophthalmic ROS: negative for - blurry vision, double vision, eye pain or loss of vision ENT ROS: negative for - epistaxis, nasal discharge, oral lesions, sore throat, tinnitus or vertigo Allergy and Immunology ROS: negative for - hives or itchy/watery eyes Hematological and Lymphatic ROS: negative for - bleeding problems, bruising or swollen lymph nodes Endocrine ROS: negative for - galactorrhea, hair pattern changes, polydipsia/polyuria or temperature intolerance Respiratory ROS: negative for - cough, hemoptysis, shortness of breath or wheezing Cardiovascular ROS: negative for - chest pain, dyspnea on exertion, edema or irregular heartbeat Gastrointestinal ROS: negative for - abdominal pain, diarrhea, hematemesis, nausea/vomiting or stool incontinence Genito-Urinary ROS: negative for - dysuria, hematuria, incontinence or urinary frequency/urgency Musculoskeletal ROS: negative for - joint swelling or muscular weakness Neurological ROS: as noted in HPI Dermatological ROS: negative for rash and skin lesion  changes  Physical Examination: Blood pressure 108/61, pulse (!) 59, temperature 98.2 F (36.8 C), temperature source Oral, resp. rate 14, height 6' (1.829 m), weight 74.3 kg, SpO2 97 %.  HEENT-  Normocephalic, no lesions, without obvious abnormality.  Normal external eye and conjunctiva.  Normal TM's bilaterally.  Normal auditory canals and external ears. Normal external nose, mucus membranes and septum.  Normal pharynx. Cardiovascular- S1, S2 normal, pulses palpable throughout   Lungs- chest clear, no wheezing, rales, normal symmetric air entry Abdomen- soft, non-tender; bowel sounds normal; no masses,  no organomegaly Extremities- no edema Lymph-no adenopathy palpable Musculoskeletal-no joint tenderness, deformity or swelling Skin-warm and dry, no hyperpigmentation, vitiligo, or suspicious lesions  Neurological Exam   Mental Status: Alert, oriented, thought content appropriate.  Speech fluent without evidence of aphasia.  Able to follow 3 step commands without difficulty. Attention span and concentration seemed appropriate  Cranial Nerves: II: Discs flat bilaterally; Visual fields grossly normal, pupils equal, round, reactive to light and accommodation III,IV, VI: ptosis not present, extra-ocular motions intact bilaterally V,VII: smile  symmetric, facial light touch sensation intact VIII: hearing normal bilaterally IX,X: gag reflex present XI: bilateral shoulder shrug XII: midline tongue extension Motor: Right :  Upper extremity   5/5 without pronator drift      Left: Upper extremity   5/5 without pronator drift Right:   Lower extremity   4/5   due to pain                                      Left: Lower extremity   5/5 Tone and bulk:normal tone throughout; no atrophy noted Sensory: Pinprick and light touch decreased on the right lower extremity Deep Tendon Reflexes: 2+ in the upper extremities and absent in the lower extremities Plantars: Right: upgoing                              Left: upgoing Cerebellar: Finger-to-nose testing intact bilaterally. Heel to shin testing not performed due to leg pain Gait: not tested due to safety concerns  Data Reviewed  Laboratory Studies:   Basic Metabolic Panel: Recent Labs  Lab 12/09/17 1726 12/10/17 0657 12/11/17 1441 12/12/17 0412 12/13/17 0340  NA 142 139 138 140 141  K 3.5 3.5 4.1 4.3 5.1  CL 105 102 105 105 107  CO2 27 27 28 30 28   GLUCOSE 174* 122* 121* 105* 98  BUN 22 25* 28* 27* 22  CREATININE 2.40* 2.44* 2.38* 2.26* 2.09*  CALCIUM 8.8* 8.3* 8.4* 8.6* 9.1  MG 1.3*  --   --  2.3  --     Liver Function Tests: Recent Labs  Lab 12/09/17 1726 12/11/17 1441 12/12/17 0412 12/13/17 0340  AST 227* 96* 60* 35  ALT 70* 104* 81* 62*  ALKPHOS 373* 266* 242* 226*  BILITOT 3.4* 1.6* 1.2 1.2  PROT 6.8 5.5* 5.5* 6.0*  ALBUMIN 3.7 2.8* 2.9* 3.1*   Recent Labs  Lab 12/09/17 1726  LIPASE 26   No results for input(s): AMMONIA in the last 168 hours.  CBC: Recent Labs  Lab 12/09/17 1726 12/10/17 0657 12/11/17 0508 12/12/17 0412 12/13/17 0340  WBC 2.5* 4.8 3.2* 3.2* 3.5*  NEUTROABS 2.2  --   --   --   --   HGB 10.8* 9.2* 9.5* 8.8* 8.8*  HCT 34.4* 29.4* 31.0* 28.2* 29.4*  MCV 96.4 96.7 97.5 97.2 99.7  PLT 116* 94* 100* 100* 111*    Cardiac Enzymes: Recent Labs  Lab 12/09/17 1726 12/10/17 0042 12/10/17 0657 12/10/17 1232  TROPONINI 0.03* 0.14* 0.11* 0.09*    BNP: Invalid input(s): POCBNP  CBG: No results for input(s): GLUCAP in the last 168 hours.  Microbiology: No results found for this or any previous visit.  Coagulation Studies: No results for input(s): LABPROT, INR in the last 72 hours.  Urinalysis: No results for input(s): COLORURINE, LABSPEC, PHURINE, GLUCOSEU, HGBUR, BILIRUBINUR, KETONESUR, PROTEINUR, UROBILINOGEN, NITRITE, LEUKOCYTESUR in the last 168 hours.  Invalid input(s): APPERANCEUR  Lipid Panel:     Component Value Date/Time   CHOL  07/17/2008 0320    126        ATP  III CLASSIFICATION:  <200     mg/dL   Desirable  200-239  mg/dL   Borderline High  >=240    mg/dL   High          TRIG 101 07/17/2008 0320   HDL 27 (L) 07/17/2008  0320   CHOLHDL 4.7 07/17/2008 0320   VLDL 20 07/17/2008 0320   LDLCALC  07/17/2008 0320    79        Total Cholesterol/HDL:CHD Risk Coronary Heart Disease Risk Table                     Men   Women  1/2 Average Risk   3.4   3.3  Average Risk       5.0   4.4  2 X Average Risk   9.6   7.1  3 X Average Risk  23.4   11.0        Use the calculated Patient Ratio above and the CHD Risk Table to determine the patient's CHD Risk.        ATP III CLASSIFICATION (LDL):  <100     mg/dL   Optimal  100-129  mg/dL   Near or Above                    Optimal  130-159  mg/dL   Borderline  160-189  mg/dL   High  >190     mg/dL   Very High    HgbA1C:  Lab Results  Component Value Date   HGBA1C  07/17/2008    5.7 (NOTE) The ADA recommends the following therapeutic goal for glycemic control related to Hgb A1c measurement: Goal of therapy: <6.5 Hgb A1c  Reference: American Diabetes Association: Clinical Practice Recommendations 2010, Diabetes Care, 2010, 33: (Suppl  1).    Urine Drug Screen:  No results found for: LABOPIA, COCAINSCRNUR, LABBENZ, AMPHETMU, THCU, LABBARB  Alcohol Level: No results for input(s): ETH in the last 168 hours.  Other results: EKG: unchanged from previous tracings, atrial fibrillation, rate 117 Vent. rate 117 BPM PR interval * ms QRS duration 139 ms QT/QTc 347/485 ms P-R-T axes * 126 30  Imaging: Dg Hip Unilat With Pelvis 2-3 Views Right  Result Date: 12/12/2017 CLINICAL DATA:  Onset of right hip pain today with no known injury. History of CHF and coronary artery disease and lung malignancy. EXAM: DG HIP (WITH OR WITHOUT PELVIS) 2-3V RIGHT COMPARISON:  None. FINDINGS: The bony pelvis is subjectively mildly osteopenic. There is no lytic nor blastic lesion. There are degenerative changes of the lower  lumbar disc levels. AP and lateral views of the right hip reveal moderate symmetric joint space loss. The articular surfaces of the right femoral head and acetabulum remains smoothly rounded. The femoral neck, intertrochanteric, and subtrochanteric regions are normal. 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 No acute bony abnormality. Electronically Signed   By: David  Martinique M.D.   On: 12/12/2017 13:24   Patient seen and examined.  Clinical course and management discussed.  Necessary edits performed.  I agree with the above.  Assessment and plan of care developed and discussed below.   Assessment: 77 y.o male with pertinent history of severe coronary artery disease with systolic failure status post AICD, orthostatic hypotension, atrial fibrillation, chronic renal insufficiency, ventral hernia, abdominal aortic aneurysm, COPD, hypertension, lung cancer, spontaneous bleed into the right calf and peripheral artery disease presenting to the ED on 12/09/2017 with chief complaints of nausea and vomiting, burning pain in epigastric region, shortness of breath, dizziness and near syncopal episode. Now with anterior aspect of right thigh pain since 12/09/2017. Denies trauma, back pain, bladder or bowel dysfunction, and saddle anesthesia.   Pain has an unusual distribution and patient with upgoing  toes bilaterally.  No bowel or bladder complaints.  Further work up recommended.    Plan 1.  We will obtain CT of the lumbar spine without contrast.   This patient was staffed with Dr. Magda Paganini, [place] [facility]ince who personally evaluated patient, reviewed documentation and agreed with assessment and plan of care as above.  Rufina Falco, DNP, FNP-BC Board certified Nurse Practitioner Neurology Department  12/13/2017, 1:47 PM    Addendum: CT of the lumbar spine is concerning for possible retroperitoneal hematoma. Anticoagulation to be discontinued and reversal protocol initiated.    Alexis Goodell, MD Neurology [phone]  12/13/2017  7:10 PM

## 2017-12-13 NOTE — Progress Notes (Addendum)
[place] for heparin Indication: atrial fibrillation  Allergies  Allergen Reactions  . Dofetilide Other (See Comments)    Reaction: Syncope  . Amiodarone Other (See Comments)    Reaction: weakness    Patient Measurements: Height: 6' (182.9 cm) Weight: 163 lb 12.8 oz (74.3 kg) IBW/kg (Calculated) : 77.6 Heparin Dosing Weight: 74.3 kg  Vital Signs: Temp: 98.2 F (36.8 C) (11/06 0836) Temp Source: Oral (11/06 0836) BP: 108/61 (11/06 1155) Pulse Rate: 59 (11/06 1155)  Labs: Recent Labs    12/11/17 0508 12/11/17 1441 12/12/17 0412 12/13/17 0340 12/13/17 0815 12/13/17 1626 12/13/17 1742  HGB 9.5*  --  8.8* 8.8*  --  7.9*  --   HCT 31.0*  --  28.2* 29.4*  --  25.8*  --   PLT 100*  --  100* 111*  --  108*  --   APTT 81*  --   --   --  43*  --  47*  HEPARINUNFRC 0.44  --  0.11*  --  2.20*  --   --   CREATININE  --  2.38* 2.26* 2.09*  --   --   --     Estimated Creatinine Clearance: 31.6 mL/min (A) (by C-G formula based on SCr of 2.09 mg/dL (H)).   Medical History: Past Medical History:  Diagnosis Date  . AAA (abdominal aortic aneurysm) ([place])   . Cancer ([place])   . CHF (congestive heart failure) ([place])   . COPD (chronic obstructive pulmonary disease) ([place])   . GERD (gastroesophageal reflux disease)   . HTN (hypertension)   . Hx of CABG   . Lung cancer [facility])     Assessment: 77 year old male with h/o afib on rivaroxaban 15 mg PTA. Rivaroxaban held on admission and heparin drip started for possible procedure and reduced renal function. Patient then transitioned back to rivaroxaban and received one dose 11/5. Pharmacy now consulted for transition back to heparin drip in anticipation of possible procedure. Patient to transfer to [facility].  Last dose of rivaroxaban 11/5 at 1000.  Goal of Therapy:  aPTT 66-102 Heparin level 0.3-0.7 units/ml Monitor platelets by anticoagulation protocol: Yes   Plan:  11/6 aPTT 47  Heparin drip  stopped due to concern for hematoma on r ileopsoas per MD note. Transfer to [place] cancelled.   Chinita Greenland PharmD Clinical Pharmacist 12/13/2017

## 2017-12-13 NOTE — Discharge Summary (Signed)
[name] at [place] NAME: [name]    MR#:  [id number]  DATE OF BIRTH:  March 05, 1940  DATE OF ADMISSION:  12/09/2017 ADMITTING PHYSICIAN: Lance Coon, MD  DATE OF DISCHARGE: 12/13/2017   PRIMARY CARE PHYSICIAN: Burnell Blanks, MD    ADMISSION DIAGNOSIS:  Hypomagnesemia [E83.42] V-tach ([place]) [I47.2] Transaminitis [R74.0] Near syncope [R55] Nausea and vomiting [R11.2] Abdominal pain [R10.9] Calculus of gallbladder with biliary obstruction but without cholecystitis [K80.21]  DISCHARGE DIAGNOSIS:  Principal Problem:   Defibrillator discharge Active Problems:   CAD (coronary artery disease)   HTN (hypertension)   GERD (gastroesophageal reflux disease)   COPD (chronic obstructive pulmonary disease) (HCC)   Chronic combined systolic and diastolic CHF (congestive heart failure) ([place])   SECONDARY DIAGNOSIS:   Past Medical History:  Diagnosis Date  . AAA (abdominal aortic aneurysm) ([place])   . Cancer ([place])   . CHF (congestive heart failure) ([place])   . COPD (chronic obstructive pulmonary disease) ([place])   . GERD (gastroesophageal reflux disease)   . HTN (hypertension)   . Hx of CABG   . Lung cancer [facility])     HOSPITAL COURSE:   * Defibrillator discharge: AICD shocked patient in ED once, pacemaker interrogated by [employer]. Current rhythm A-fib, rate controlled post Amio bolus. Patient admitted to Telemetry with continuous cardiac monitoring, Cardiology consulted, echocardiogram ordered.  Cardio suggest no medication change but continue with transferring to [place]. Initially the plan was discharging home from here and not transferred to [place]. Cardiology strongly suggested to transfer to [facility] today as he would need interrogation and may be change in the setting of his pacemaker along with possible catheterization.  [facility] called again for need of transfer.  Accepted by Dr.Geurink  * Right thigh pain   Xray pelvis/ hip  was negative.    Did CT lumber spine- showed possible edema/  Hematoma on right ileopsoas.   Vital stable, stat CBC, Vascular consult.    Stopped heparine drip.  * Cholelithiasis: HIDA scan ordered, based on results with consult surgery versus GI.  HIDA scan is done and does not show acute cholecystitis. Patient's LFT and bilirubin is improving.  And he is tolerating diet without any difficulties or nausea. He would not need any further interventions for this.  * Chronic atrial Fibrillation: Patient Xarelto discontinued due to reduced GFR and anticipation of procedure, pharmacy consulted for heparin gtt. Home antiarrhythmicContinued.  Stopped drip due to concern of hematoma  * Acute on Chronic Kidney Injury:  CKD stage IV Likely related to cardiorenal syndrome, though some dehydration given his recent vomiting could be a contributing factor. We will not administer IVF due to his historically very low EF. Avoid nephrotoxins and monitor.  *CAD (coronary artery disease): home medications continued  hold ASA due to possible hematoma.  *HTN (hypertension): home medications continued  *COPD (chronic obstructive pulmonary disease) ([place]): home medications continued  *Chronic combined systolic and diastolic CHF (congestive heart failure) ([place]): home medications continued  *GERD (gastroesophageal reflux disease): PPI ordered, zofran PRN given  DISCHARGE CONDITIONS:   Stable.  CONSULTS OBTAINED:  Treatment Team:  Sherren Mocha, MD Wellington Hampshire, MD Alexis Goodell, MD  DRUG ALLERGIES:   Allergies  Allergen Reactions  . Dofetilide Other (See Comments)    Reaction: Syncope  . Amiodarone Other (See Comments)    Reaction: weakness    DISCHARGE MEDICATIONS:   Allergies as of 12/13/2017      Reactions  Dofetilide Other (See Comments)   Reaction: Syncope   Amiodarone Other (See Comments)   Reaction: weakness      Medication List    STOP  taking these medications   Rivaroxaban 15 MG Tabs tablet Commonly known as:  XARELTO     TAKE these medications   atorvastatin 40 MG tablet Commonly known as:  LIPITOR Take 0.5 tablets by mouth at bedtime.   finasteride 5 MG tablet Commonly known as:  PROSCAR Take 1 tablet by mouth daily.   furosemide 20 MG tablet Commonly known as:  LASIX Take 20 mg by mouth 2 (two) times daily as needed for fluid.   metoprolol succinate 25 MG 24 hr tablet Commonly known as:  TOPROL-XL Take 37.5 mg by mouth at bedtime.   mexiletine 150 MG capsule Commonly known as:  MEXITIL Take 1 capsule by mouth 3 (three) times daily.   midodrine 5 MG tablet Commonly known as:  PROAMATINE Take 5 mg by mouth 3 (three) times daily.   nitroGLYCERIN 0.4 MG SL tablet Commonly known as:  NITROSTAT Place 0.4 mg under the tongue every 5 (five) minutes as needed for chest pain.   omeprazole 20 MG capsule Commonly known as:  PRILOSEC Take 1 capsule by mouth 2 (two) times daily.   oxyCODONE 5 MG immediate release tablet Commonly known as:  Oxy IR/ROXICODONE Take 1 tablet (5 mg total) by mouth every 6 (six) hours as needed for moderate pain.   STIOLTO RESPIMAT 2.5-2.5 MCG/ACT Aers Generic drug:  Tiotropium Bromide-Olodaterol Inhale 2 puffs into the lungs daily.   tamsulosin 0.4 MG Caps capsule Commonly known as:  FLOMAX Take 1 capsule by mouth at bedtime.        DISCHARGE INSTRUCTIONS:    Follow as advised by [facility] cardiology team.  If you experience worsening of your admission symptoms, develop shortness of breath, life threatening emergency, suicidal or homicidal thoughts you must seek medical attention immediately by calling 911 or calling your MD immediately  if symptoms less severe.  You Must read complete instructions/literature along with all the possible adverse reactions/side effects for all the Medicines you take and that have been prescribed to you. Take any new Medicines after you have  completely understood and accept all the possible adverse reactions/side effects.   Please note  You were cared for by a hospitalist during your hospital stay. If you have any questions about your discharge medications or the care you received while you were in the hospital after you are discharged, you can call the unit and asked to speak with the hospitalist on call if the hospitalist that took care of you is not available. Once you are discharged, your primary care physician will handle any further medical issues. Please note that NO REFILLS for any discharge medications will be authorized once you are discharged, as it is imperative that you return to your primary care physician (or establish a relationship with a primary care physician if you do not have one) for your aftercare needs so that they can reassess your need for medications and monitor your lab values.    Today   CHIEF COMPLAINT:   Chief Complaint  Patient presents with  . Nausea    HISTORY OF PRESENT ILLNESS:  [name]  is a 77 y.o. male with a known history of significant cardiac history post CABG, with CHF post AICD and a AAA presented with nausea, vomiting and weakness.  Upon arrival at the ED the patient went into ventricular  fibrillation and was shocked by his AICD converting him back to Atrial fibrillation which is the patient's reported chronic rhythm.  Patient reported becoming nauseous and vomiting 3-4 times this afternoon 2-3 hours after eating a cheese dog.  Patient also reports feeling a "fire" across his lower abdomen, with chills, fatigue and increased thirst.  This is the third episode like this the patient reports having, in previous episodes he was unable to tolerate any type of food for 1-2 days before it went away.  Patient denies fever/chest pain/diaphoresis.  Patient denies blood in his emesis, but reports having a "darker than usual" stool today.  Patient reports having increased dyspnea overnight and in  the early morning, patient used his home inhalers with limited success.  Patient is currently being treated for lung cancer with radiation treatment, most recent course completed 1 week ago.  Abdominal US positive for sludge and a mildly dilated common bile duct in gallbladder with multiple stones, as well as a 2.9 cm mass on the left hepatic lobe.  Liver enzymes and Cr. are markedly elevated from baseline. Hospitalist consulted for admission.   VITAL SIGNS:  Blood pressure 108/61, pulse (!) 59, temperature 98.2 F (36.8 C), temperature source Oral, resp. rate 14, height 6' (1.829 m), weight 74.3 kg, SpO2 97 %.  I/O:    Intake/Output Summary (Last 24 hours) at 12/13/2017 1643 Last data filed at 12/13/2017 1222 Gross per 24 hour  Intake 3 ml  Output 950 ml  Net -947 ml    PHYSICAL EXAMINATION:  GENERAL:  77 y.o.-year-old patient lying in the bed with no acute distress.  EYES: Pupils equal, round, reactive to light and accommodation. No scleral icterus. Extraocular muscles intact.  HEENT: Head atraumatic, normocephalic. Oropharynx and nasopharynx clear.  NECK:  Supple, no jugular venous distention. No thyroid enlargement, no tenderness.  LUNGS: Normal breath sounds bilaterally, no wheezing, rales,rhonchi or crepitation. No use of accessory muscles of respiration.  CARDIOVASCULAR: S1, S2 normal. No murmurs, rubs, or gallops. AICD in place. ABDOMEN: Soft, nontender, nondistended. Bowel sounds present. No organomegaly or mass.  EXTREMITIES: No pedal edema, cyanosis, or clubbing.  Tender on the right thigh on local pressure but able to move distal feet and knee. NEUROLOGIC: Cranial nerves II through XII are intact. Muscle strength 5/5 in all extremities. Sensation intact. Gait not checked.  PSYCHIATRIC: The patient is alert and oriented x 3.  SKIN: No obvious rash, lesion, or ulcer.   DATA REVIEW:   CBC Recent Labs  Lab 12/13/17 0340  WBC 3.5*  HGB 8.8*  HCT 29.4*  PLT 111*     Chemistries  Recent Labs  Lab 12/12/17 0412 12/13/17 0340  NA 140 141  K 4.3 5.1  CL 105 107  CO2 30 28  GLUCOSE 105* 98  BUN 27* 22  CREATININE 2.26* 2.09*  CALCIUM 8.6* 9.1  MG 2.3  --   AST 60* 35  ALT 81* 62*  ALKPHOS 242* 226*  BILITOT 1.2 1.2    Cardiac Enzymes Recent Labs  Lab 12/10/17 1232  TROPONINI 0.09*    Microbiology Results  No results found for this or any previous visit.  RADIOLOGY:  Ct Lumbar Spine Wo Contrast  Result Date: 12/13/2017 CLINICAL DATA:  77 y/o  M; right anterior hip pain and claudication. EXAM: CT LUMBAR SPINE WITHOUT CONTRAST TECHNIQUE: Multidetector CT imaging of the lumbar spine was performed without intravenous contrast administration. Multiplanar CT image reconstructions were also generated. COMPARISON:  12/09/2017 abdominal ultrasound FINDINGS: Segmentation:  5 lumbar type vertebrae. Alignment: Straightening of lumbar lordosis. L4-5 grade 1 retrolisthesis. Vertebrae: No acute fracture or focal pathologic process. Paraspinal and other soft tissues: Multiple simple and hemorrhagic cysts of the kidneys bilaterally. 26 mm indeterminate lesion of the lower pole of left kidney measuring 38 HU. Small right pleural effusion. Abdominal aortic calcific atherosclerosis. Abdominal aortic aneurysm measuring up to 3.6 cm. Edema within the right retroperitoneum along the course of iliopsoas muscles, partially visualized. Disc levels: Mild L3-4, moderate L4-5, mild L5-S1 loss of intervertebral disc space height and discogenic degenerative changes. Disc and facet degenerative changes result in mild foraminal stenosis bilaterally at the L4-5 and L5-S1 levels. No high-grade canal stenosis. IMPRESSION: 1. No acute fracture or dislocation of the lumbar spine. 2. Edema within the right retroperitoneum along the course of iliopsoas muscles, partially visualized. This may represent retroperitoneal hematoma, trauma, or myositis. 3. Abdominal aortic aneurysm measuring  up to 3.6 cm. Recommend followup by ultrasound in 2 years. This recommendation follows ACR consensus guidelines: White Paper of the ACR Incidental Findings Committee II on Vascular Findings. J Am Coll Radiol 2013; 10:789-794. 4. Small right pleural effusion. 5. 26 mm indeterminate lesion of lower pole of left kidney measuring 38 HU. Further evaluation with renal protocol CT or MRI is recommended. Electronically Signed   By: Kristine Garbe M.D.   On: 12/13/2017 16:01   Dg Hip Unilat With Pelvis 2-3 Views Right  Result Date: 12/12/2017 CLINICAL DATA:  Onset of right hip pain today with no known injury. History of CHF and coronary artery disease and lung malignancy. EXAM: DG HIP (WITH OR WITHOUT PELVIS) 2-3V RIGHT COMPARISON:  None. FINDINGS: The bony pelvis is subjectively mildly osteopenic. There is no lytic nor blastic lesion. There are degenerative changes of the lower lumbar disc levels. AP and lateral views of the right hip reveal moderate symmetric joint space loss. The articular surfaces of the right femoral head and acetabulum remains smoothly rounded. The femoral neck, intertrochanteric, and subtrochanteric regions are normal. 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 No acute bony abnormality. Electronically Signed   By: David  Martinique M.D.   On: 12/12/2017 13:24    EKG:   Orders placed or performed during the hospital encounter of 12/09/17  . EKG 12-Lead  . EKG 12-Lead      Management plans discussed with the patient, family and they are in agreement.  CODE STATUS: Full.    Code Status Orders  (From admission, onward)         Start     Ordered   12/10/17 0039  Full code  Continuous     12/10/17 0038        Code Status History    This patient has a current code status but no historical code status.    Advance Directive Documentation     Most Recent Value  Type of Advance Directive  Healthcare Power of Attorney, Living will   Pre-existing out of facility DNR order (yellow form or pink MOST form)  -  "MOST" Form in Place?  -      TOTAL TIME TAKING CARE OF THIS PATIENT: 45 minutes.    Vaughan Basta M.D on 12/13/2017 at 4:43 PM  Between 7am to 6pm - Pager - [phone]  After 6pm go to [email or web address] - password [email or web address] [facility]  Office  [phone]  CC: Primary care physician; Burnell Blanks, MD   Note: This dictation was prepared with Dragon dictation  along with smaller phrase technology. Any transcriptional errors that result from this process are unintentional.

## 2017-12-13 NOTE — Progress Notes (Signed)
[place] for heparin Indication: atrial fibrillation  Allergies  Allergen Reactions  . Dofetilide Other (See Comments)    Reaction: Syncope  . Amiodarone Other (See Comments)    Reaction: weakness    Patient Measurements: Height: 6' (182.9 cm) Weight: 163 lb 12.8 oz (74.3 kg) IBW/kg (Calculated) : 77.6 Heparin Dosing Weight: 74.3 kg  Vital Signs: Temp: 98.2 F (36.8 C) (11/06 0836) Temp Source: Oral (11/06 0836) BP: 113/69 (11/06 0836) Pulse Rate: 63 (11/06 0836)  Labs: Recent Labs    12/10/17 1232  12/10/17 2347  12/11/17 0508 12/11/17 1441 12/12/17 0412 12/13/17 0340 12/13/17 0815  HGB  --   --   --    < > 9.5*  --  8.8* 8.8*  --   HCT  --   --   --   --  31.0*  --  28.2* 29.4*  --   PLT  --   --   --   --  100*  --  100* 111*  --   APTT  --    < > 93*  --  81*  --   --   --  43*  HEPARINUNFRC  --   --   --   --  0.44  --  0.11*  --  2.20*  CREATININE  --   --   --   --   --  2.38* 2.26* 2.09*  --   TROPONINI 0.09*  --   --   --   --   --   --   --   --    < > = values in this interval not displayed.    Estimated Creatinine Clearance: 31.6 mL/min (A) (by C-G formula based on SCr of 2.09 mg/dL (H)).   Medical History: Past Medical History:  Diagnosis Date  . AAA (abdominal aortic aneurysm) ([place])   . Cancer ([place])   . CHF (congestive heart failure) ([place])   . COPD (chronic obstructive pulmonary disease) ([place])   . GERD (gastroesophageal reflux disease)   . HTN (hypertension)   . Hx of CABG   . Lung cancer [facility])     Assessment: 77 year old male with h/o afib on rivaroxaban 15 mg PTA. Rivaroxaban held on admission and heparin drip started for possible procedure and reduced renal function. Patient then transitioned back to rivaroxaban and received one dose 11/5. Pharmacy now consulted for transition back to heparin drip in anticipation of possible procedure. Patient to transfer to [facility].  Last dose of rivaroxaban  11/5 at 1000.  Goal of Therapy:  Heparin level 0.3-0.7 units/ml Monitor platelets by anticoagulation protocol: Yes   Plan:  Heparin 3700 unit bolus given. Heparin drip started at 1000 units/hr. HL and APTT ordered for 1800. CBC with morning labs.  Tawnya Crook, PharmD Pharmacy Resident  12/13/2017 10:40 AM

## 2017-12-13 NOTE — Progress Notes (Signed)
Report called to [employer] called for transportation, patient updated on situation.

## 2017-12-13 NOTE — Plan of Care (Signed)
  Problem: Nutrition: Goal: Adequate nutrition will be maintained Outcome: Progressing   Problem: Coping: Goal: Level of anxiety will decrease Outcome: Progressing   Problem: Elimination: Goal: Will not experience complications related to urinary retention Outcome: Progressing   Problem: Pain Managment: Goal: General experience of comfort will improve Outcome: Progressing Note:  Pt still complaining of right leg pain, treated twice once with oxycodone & once with tylenol which gave some relief.   Problem: Safety: Goal: Ability to remain free from injury will improve Outcome: Progressing   Problem: Skin Integrity: Goal: Risk for impaired skin integrity will decrease Outcome: Progressing   Problem: Education: Goal: Knowledge of General Education information will improve Description Including pain rating scale, medication(s)/side effects and non-pharmacologic comfort measures Outcome: Completed/Met

## 2017-12-13 NOTE — Progress Notes (Signed)
[facility] had arrived to transport patient when notified by [facility] that transfer had been cancelled d/t patient condition. Accepting MD would like to hold patient overnight to make sure patient is stable. Attending MD notified and confirmed information, [facility] notified, Vascular MD aware of situation and seeing pt at this time. Patient aware of situation. Will continue to assess and monitor.

## 2017-12-13 NOTE — Progress Notes (Signed)
Progress Note  Patient Name: [name] Date of Encounter: 12/13/2017  Primary Cardiologist: [place] worsening leg pain on the right, numbness Progressive through the course of the day Symptoms initially started on arrival, leg felt heavy CT scan performed showing retroperitoneal hematoma, 6 cm Transfer to [place] was temporary held Having severe pain right leg with any movement Very mild improvement with Dilaudid 1 mg, also received oral medications, oxycodone  Long discussion with patient and family at the bedside concerning prior cardiac history, defibrillators, heart attacks, stenting, renal dysfunction, experience with various antiarrhythmic medications including amiodarone and Tikosyn which he did not tolerate  Inpatient Medications    Scheduled Meds: . arformoterol  15 mcg Nebulization BID  . atorvastatin  20 mg Oral Daily  . finasteride  5 mg Oral Daily  . Melatonin  10 mg Oral QHS  . metoprolol succinate  37.5 mg Oral Daily  . mexiletine  150 mg Oral Q8H  . midodrine  5 mg Oral TID  . pantoprazole  40 mg Oral Daily  . sodium chloride flush  3 mL Intravenous Q12H  . tamsulosin  0.4 mg Oral Daily  . umeclidinium bromide  1 puff Inhalation Daily   Continuous Infusions:  PRN Meds: acetaminophen **OR** acetaminophen, furosemide, HYDROmorphone (DILAUDID) injection, ondansetron **OR** ondansetron (ZOFRAN) IV, oxyCODONE   Vital Signs    Vitals:   12/13/17 0730 12/13/17 0836 12/13/17 1155 12/13/17 2004  BP:  113/69 108/61   Pulse:  63 (!) 59   Resp:  14    Temp:  98.2 F (36.8 C)    TempSrc:  Oral    SpO2: 95% 94% 97% 94%  Weight:      Height:        Intake/Output Summary (Last 24 hours) at 12/13/2017 2048 Last data filed at 12/13/2017 1853 Gross per 24 hour  Intake 3 ml  Output 875 ml  Net -872 ml   Filed Weights   12/11/17 0549 12/12/17 0450 12/13/17 0500  Weight: 75.6 kg 76.3 kg 74.3 kg    Telemetry    Atrial flutter with  ventricular rates from 44 to 70 bpm - Personally Reviewed  ECG    No new tracing - Personally Reviewed  Physical Exam   Constitutional:  oriented to person, place, and time.  Severe distress when moving his right leg HENT:  Head: Grossly normal Eyes:  no discharge. No scleral icterus.  Neck: No JVD, no carotid bruits  Cardiovascular: Regular rate and rhythm, 2/6 SEM  appreciated Pulmonary/Chest: Moderately decreased breath sounds, scattered Rales  Abdominal: Soft.  no distension.  no tenderness.  Musculoskeletal: Normal range of motion Neurological:  normal muscle tone. Coordination normal. No atrophy Skin: Skin warm and dry Psychiatric: normal affect, pleasant   Labs    Chemistry Recent Labs  Lab 12/11/17 1441 12/12/17 0412 12/13/17 0340  NA 138 140 141  K 4.1 4.3 5.1  CL 105 105 107  CO2 28 30 28   GLUCOSE 121* 105* 98  BUN 28* 27* 22  CREATININE 2.38* 2.26* 2.09*  CALCIUM 8.4* 8.6* 9.1  PROT 5.5* 5.5* 6.0*  ALBUMIN 2.8* 2.9* 3.1*  AST 96* 60* 35  ALT 104* 81* 62*  ALKPHOS 266* 242* 226*  BILITOT 1.6* 1.2 1.2  GFRNONAA 25* 26* 29*  GFRAA 29* 31* 34*  ANIONGAP 5 5 6      Hematology Recent Labs  Lab 12/12/17 0412 12/13/17 0340 12/13/17 1626  WBC 3.2* 3.5* 3.7*  RBC 2.90* 2.95* 2.63*  HGB 8.8* 8.8* 7.9*  HCT 28.2* 29.4* 25.8*  MCV 97.2 99.7 98.1  MCH 30.3 29.8 30.0  MCHC 31.2 29.9* 30.6  RDW 14.4 14.3 14.3  PLT 100* 111* 108*    Cardiac Enzymes Recent Labs  Lab 12/09/17 1726 12/10/17 0042 12/10/17 0657 12/10/17 1232  TROPONINI 0.03* 0.14* 0.11* 0.09*   No results for input(s): TROPIPOC in the last 168 hours.   BNPNo results for input(s): BNP, PROBNP in the last 168 hours.   DDimer No results for input(s): DDIMER in the last 168 hours.   Radiology    Ct Abdomen Pelvis Wo Contrast  Result Date: 12/13/2017 CLINICAL DATA:  Abdominal pain. Psoas abscess versus hematoma. Unable to lift right leg. EXAM: CT ABDOMEN AND PELVIS WITHOUT CONTRAST  TECHNIQUE: Multidetector CT imaging of the abdomen and pelvis was performed following the standard protocol without IV contrast. COMPARISON:  Lumbar spine CT 12/13/2017. Right upper quadrant abdominal ultrasound 12/09/2017. FINDINGS: Lower chest: Small bilateral pleural effusions with dependent atelectasis in the lower lobes. Cardiomegaly. Partially visualized ICD leads. Hepatobiliary: 5.1 x 3.9 cm heterogeneously hypoattenuating mass in the lateral segment of the left hepatic lobe. Calcified stones in the gallbladder. Multiple calcified stones in the common bile duct including an 11 mm stone distally. Common bile duct dilatation with a diameter of 11 mm. Pancreas: No pancreatic ductal dilatation or inflammation. Spleen: Punctate calcified granulomas in the spleen. Adrenals/Urinary Tract: Mild nodular thickening of the right adrenal gland. Unremarkable left adrenal gland. 4 mm right upper pole renal calculus. Suspected vascular calcifications in both renal hila. No hydronephrosis. Multiple small low-density lesions in both kidneys compatible with cysts. Small hyperdense lesions in both kidneys potentially reflecting proteinaceous or hemorrhagic cysts. 2.4 cm exophytic left lower pole renal lesion with intermediate attenuation. 2 cm left-sided bladder diverticulum. Stomach/Bowel: There is likely a small sliding hiatal hernia. There is an epigastric hernia containing a short segment of transverse colon without evidence of bowel obstruction or inflammation. Mild sigmoid colon diverticulosis is noted without evidence of diverticulitis. There is a diverticulum of the third portion of the duodenum without associated inflammation. The appendix is unremarkable. Vascular/Lymphatic: Prior aorto bi-iliac repair of an infrarenal abdominal aortic aneurysm aneurysmal dilatation of the more proximal abdominal aorta 3.6 cm diameter as described on earlier lumbar spine CT. Fusiform aneurysmal dilatation of the right common femoral  artery to 2.3 cm diameter. Reproductive: Heterogeneous, enlarged prostate indenting the bladder base. Other: Right iliopsoas hematoma measures 6.4 x 5.7 x 2.1 cm (transverse x AP x craniocaudal) with additional retroperitoneal stranding extending superiorly along the more proximal psoas muscle. Musculoskeletal: Grade 1 retrolisthesis of L4 on L5 with lumbar disc degeneration more fully evaluated on earlier CT. IMPRESSION: 1. 6.4 cm right retroperitoneal hematoma. 2. Cholelithiasis and choledocholithiasis with biliary dilatation. 3. 5.1 cm mass in the lateral segment of the left hepatic lobe, incompletely characterized on this noncontrast study. Recommend nonemergent contrast-enhanced abdominal MRI or CT for further evaluation. 4. Bilateral renal lesions likely largely reflecting cysts of varying complexity. 2.4 cm indeterminate left lower pole renal lesion could be further evaluated with contrast-enhanced abdominal MRI or renal mass protocol CT. 5. Small bilateral pleural effusions. 6. Epigastric hernia containing a short segment of transverse colon. No bowel obstruction. 7. Prior infrarenal abdominal aortic aneurysm repair. 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 Aortic Atherosclerosis (ICD10-I70.0). Aortic aneurysm NOS (ICD10-I71.9). Electronically Signed   By: Logan Bores M.D.   On: 12/13/2017 19:29  Ct Lumbar Spine Wo Contrast  Result Date: 12/13/2017 CLINICAL DATA:  77 y/o  M; right anterior hip pain and claudication. EXAM: CT LUMBAR SPINE WITHOUT CONTRAST TECHNIQUE: Multidetector CT imaging of the lumbar spine was performed without intravenous contrast administration. Multiplanar CT image reconstructions were also generated. COMPARISON:  12/09/2017 abdominal ultrasound FINDINGS: Segmentation: 5 lumbar type vertebrae. Alignment: Straightening of lumbar lordosis. L4-5 grade 1 retrolisthesis. Vertebrae: No acute fracture or focal pathologic process. Paraspinal and other  soft tissues: Multiple simple and hemorrhagic cysts of the kidneys bilaterally. 26 mm indeterminate lesion of the lower pole of left kidney measuring 38 HU. Small right pleural effusion. Abdominal aortic calcific atherosclerosis. Abdominal aortic aneurysm measuring up to 3.6 cm. Edema within the right retroperitoneum along the course of iliopsoas muscles, partially visualized. Disc levels: Mild L3-4, moderate L4-5, mild L5-S1 loss of intervertebral disc space height and discogenic degenerative changes. Disc and facet degenerative changes result in mild foraminal stenosis bilaterally at the L4-5 and L5-S1 levels. No high-grade canal stenosis. IMPRESSION: 1. No acute fracture or dislocation of the lumbar spine. 2. Edema within the right retroperitoneum along the course of iliopsoas muscles, partially visualized. This may represent retroperitoneal hematoma, trauma, or myositis. 3. Abdominal aortic aneurysm measuring up to 3.6 cm. Recommend followup by ultrasound in 2 years. This recommendation follows ACR consensus guidelines: White Paper of the ACR Incidental Findings Committee II on Vascular Findings. J Am Coll Radiol 2013; 10:789-794. 4. Small right pleural effusion. 5. 26 mm indeterminate lesion of lower pole of left kidney measuring 38 HU. Further evaluation with renal protocol CT or MRI is recommended. Electronically Signed   By: Kristine Garbe M.D.   On: 12/13/2017 16:01   Dg Hip Unilat With Pelvis 2-3 Views Right  Result Date: 12/12/2017 CLINICAL DATA:  Onset of right hip pain today with no known injury. History of CHF and coronary artery disease and lung malignancy. EXAM: DG HIP (WITH OR WITHOUT PELVIS) 2-3V RIGHT COMPARISON:  None. FINDINGS: The bony pelvis is subjectively mildly osteopenic. There is no lytic nor blastic lesion. There are degenerative changes of the lower lumbar disc levels. AP and lateral views of the right hip reveal moderate symmetric joint space loss. The articular surfaces  of the right femoral head and acetabulum remains smoothly rounded. The femoral neck, intertrochanteric, and subtrochanteric regions are normal. 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 No acute bony abnormality. Electronically Signed   By: David  Martinique M.D.   On: 12/12/2017 13:24    Cardiac Studies   Echo (12/10/17): - Left ventricle: The cavity size was moderately dilated. Systolic   function was moderately to severely reduced. The estimated   ejection fraction was in the range of 30% to 35%. Diffuse   hypokinesis. Hypokinesis of the inferior myocardium. The study is   not technically sufficient to allow evaluation of LV diastolic   function. - Aortic valve: There was mild regurgitation. - Mitral valve: There was severe regurgitation directed   eccentrically and toward the free wall. - Left atrium: The atrium was severely dilated. - Right ventricle: Pacer wire or catheter noted in right ventricle.   Systolic function was mildly reduced. - Right atrium: The atrium was moderately dilated. - Pulmonary arteries: Systolic pressure was severely elevated PA   peak pressure: 65 mm Hg (S).  Patient Profile     77 y.o. male with h/o VT s/p 12/10/17 discharge and intolerant to amiodarone, CAD s/p CABG and PCI, ICM and HFrEF, MR, orthostatic  hypotension, CDK IV, COPD,  admitted after ICD therapy for ventricular tachycardia.  Assessment & Plan    Ventricular tachycardia No further recurrence since admission.  Continue mexiletine 150 mg every 8 hours and metoprolol succinate 37.5 mg daily. -Needs cardiac catheterization likely staged procedure given CKD stage IV.  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   Unable to add ACE inhibitor/ARB/aldosterone antagonist in the setting of advanced CKD.  Coronary artery disease Chest pain yesterday In the setting of recurrent VT  requiring shocks 2 days ago,  Would recommend left heart catheterization   Mild troponin elevation on admission Continue atorvastatin and metoprolol.  Retroperitoneal hematoma Patient admits to violent shaking and thrashing prior to admission,  Etiology unclear, possible infection, unable to rule out gallbladder disease Unable to exclude  capillary tear at that time, leg was heavy on arrival, likely had bleeding at that time, coming off Xarelto started on heparin likely had continued retroperitoneal bleed Right leg numb from groin to the knee, ear pain with any movement H&H every 6 hours, consider repeat CT noncontrast tomorrow  Permanent atrial fibrillation Relatively slow ventricular response noted. Off anticoagulation given retroperitoneal bleed  Hypotension Presumably exacerbated secondary to blood loss  on midodrine 5 mg 3 times daily with meals.  Home  Chronic kidney disease stage IV Creatinine appears to be at baseline  Non-small cell lung cancer Ongoing treatment and management per internal medicine and oncology.  Very complicated case, multiorgan system involvement  Total encounter time more than 35 minutes  Greater than 50% was spent in counseling and coordination of care with the patient   For questions or updates, please contact [place] Please consult [email or web address] for contact info under [facility].     Signed, Ida Rogue, MD  12/13/2017, 8:48 PM

## 2017-12-13 NOTE — Care Management (Signed)
[name] with the [name] has called RNCM to confirm that the patient has been accepted to transfer room 7B at the [facility].  Information for nurse to nurse report given to [place], RN to call report.  She will notify [facility] of need for transfer.  Dr. Anselm Jungling notified.

## 2017-12-13 NOTE — Care Management (Signed)
Received call from [place] at the [place] in [place].  She should have an answer in about 2 hours as far as transfer.  They will page Dr. Anselm Jungling and if they cannot get him RNCM told Hassan Rowan to have them call my number.  Notified MD of phone call.

## 2017-12-13 NOTE — Consult Note (Signed)
[facility] Vascular Consult Note  MRN : [id number]  [name] is a 77 y.o. (1940-02-21) male who presents with chief complaint of  Chief Complaint  Patient presents with  . Nausea  .  History of Present Illness:   I am asked to evaluate the patient by Dr Anselm Jungling for right leg pain.  The patient is a 77 yo male with a history of COPD, CAD status post CABG, lung cancer on radiation therapy, congestive heart failure status post AICD, AAA who developed right leg pain.  He was seen by neurology who ordered a CT of the lumbar spine that suggested a right psoas hematoma.  CT of the abdomen was obtained which confirmed right psoas hematoma  Current Meds  Medication Sig  . atorvastatin (LIPITOR) 40 MG tablet Take 0.5 tablets by mouth at bedtime.   . finasteride (PROSCAR) 5 MG tablet Take 1 tablet by mouth daily.  . furosemide (LASIX) 20 MG tablet Take 20 mg by mouth 2 (two) times daily as needed for fluid.  . metoprolol succinate (TOPROL-XL) 25 MG 24 hr tablet Take 37.5 mg by mouth at bedtime.   . mexiletine (MEXITIL) 150 MG capsule Take 1 capsule by mouth 3 (three) times daily.   . midodrine (PROAMATINE) 5 MG tablet Take 5 mg by mouth 3 (three) times daily.   Marland Kitchen omeprazole (PRILOSEC) 20 MG capsule Take 1 capsule by mouth 2 (two) times daily.   . Rivaroxaban (XARELTO) 15 MG TABS tablet Take 15 mg by mouth daily.   . tamsulosin (FLOMAX) 0.4 MG CAPS capsule Take 1 capsule by mouth at bedtime.   . Tiotropium Bromide-Olodaterol (STIOLTO RESPIMAT) 2.5-2.5 MCG/ACT AERS Inhale 2 puffs into the lungs daily.    Past Medical History:  Diagnosis Date  . AAA (abdominal aortic aneurysm) ([place])   . Cancer ([place])   . CHF (congestive heart failure) ([place])   . COPD (chronic obstructive pulmonary disease) ([place])   . GERD (gastroesophageal reflux disease)   . HTN (hypertension)   . Hx of CABG   . Lung cancer [facility])     Past Surgical History:  Procedure Laterality Date  .  CARDIAC DEFIBRILLATOR PLACEMENT    . CORONARY ARTERY BYPASS GRAFT      Social History Social History   Tobacco Use  . Smoking status: Former Smoker    Years: 15.00    Types: Cigarettes  . Smokeless tobacco: Former Systems developer    Types: Snuff  . Tobacco comment: quit 3 years ago  Substance Use Topics  . Alcohol use: Not Currently  . Drug use: Not Currently    Family History Family History  Problem Relation Age of Onset  . Heart disease Mother   . Diabetes Father   . COPD Sister   . COPD Brother   No family history of bleeding/clotting disorders, porphyria or autoimmune disease   Allergies  Allergen Reactions  . Dofetilide Other (See Comments)    Reaction: Syncope  . Amiodarone Other (See Comments)    Reaction: weakness     REVIEW OF SYSTEMS (Negative unless checked)  Constitutional: [] Weight loss  [] Fever  [] Chills Cardiac: [] Chest pain   [] Chest pressure   [] Palpitations   [] Shortness of breath when laying flat   [] Shortness of breath at rest   [x] Shortness of breath with exertion. Vascular:  [] Pain in legs with walking   [x] Pain in legs at rest   [x] Pain in legs when laying flat   [] Claudication   [] Pain in  feet when walking  [] Pain in feet at rest  [] Pain in feet when laying flat   [] History of DVT   [] Phlebitis   [] Swelling in legs   [] Varicose veins   [] Non-healing ulcers Pulmonary:   [] Uses home oxygen   [] Productive cough   [] Hemoptysis   [] Wheeze  [] COPD   [] Asthma Neurologic:  [] Dizziness  [] Blackouts   [] Seizures   [] History of stroke   [] History of TIA  [] Aphasia   [] Temporary blindness   [] Dysphagia   [] Weakness or numbness in arms   [] Weakness or numbness in legs Musculoskeletal:  [] Arthritis   [] Joint swelling   [] Joint pain   [] Low back pain Hematologic:  [] Easy bruising  [] Easy bleeding   [] Hypercoagulable state   [] Anemic  [] Hepatitis Gastrointestinal:  [] Blood in stool   [] Vomiting blood  [] Gastroesophageal reflux/heartburn   [] Difficulty  swallowing. Genitourinary:  [] Chronic kidney disease   [] Difficult urination  [] Frequent urination  [] Burning with urination   [] Blood in urine Skin:  [] Rashes   [] Ulcers   [] Wounds Psychological:  [] History of anxiety   []  History of major depression.  Physical Examination  Vitals:   12/13/17 0730 12/13/17 0836 12/13/17 1155 12/13/17 2004  BP:  113/69 108/61   Pulse:  63 (!) 59   Resp:  14    Temp:  98.2 F (36.8 C)    TempSrc:  Oral    SpO2: 95% 94% 97% 94%  Weight:      Height:       Body mass index is 22.22 kg/m. Gen:  WD/WN, NAD Head: [place]/AT, No temporalis wasting. Prominent temp pulse not noted. Ear/Nose/Throat: Hearing grossly intact, nares w/o erythema or drainage, oropharynx w/o Erythema/Exudate Eyes: PERRLA, EOMI.  Neck: Supple, no nuchal rigidity.  No bruit or JVD.  Pulmonary:  Good air movement, clear to auscultation bilaterally.  Cardiac: RRR, normal S1, S2, no Murmurs, rubs or gallops. Vascular:  Vessel Right Left  Radial Palpable Palpable  Popliteal Not Palpable Not Palpable  PT Not Palpable Not Palpable  DP Not Palpable Not Palpable   Gastrointestinal: soft, non-tender/non-distended. No guarding/reflex. No masses, surgical incisions, or scars. Musculoskeletal: M/S 5/5 throughout.  Extremities without ischemic changes.  No deformity or atrophy. No edema. Neurologic: CN 2-12 intact. Pain and light touch intact in extremities.  Symmetrical.  Speech is fluent. Motor exam as listed above. Psychiatric: Judgment intact, Mood & affect appropriate for pt's clinical situation. Dermatologic: No rashes or ulcers noted.  No cellulitis or open wounds. Lymph : No Cervical, Axillary, or Inguinal lymphadenopathy.    CBC Lab Results  Component Value Date   WBC 3.7 (L) 12/13/2017   HGB 7.9 (L) 12/13/2017   HCT 25.8 (L) 12/13/2017   MCV 98.1 12/13/2017   PLT 108 (L) 12/13/2017    BMET    Component Value Date/Time   NA 141 12/13/2017 0340   NA 139 02/27/2013 0743    K 5.1 12/13/2017 0340   K 3.4 (L) 02/27/2013 0743   CL 107 12/13/2017 0340   CL 103 02/27/2013 0743   CO2 28 12/13/2017 0340   CO2 29 02/27/2013 0743   GLUCOSE 98 12/13/2017 0340   GLUCOSE 113 (H) 02/27/2013 0743   BUN 22 12/13/2017 0340   BUN 15 02/27/2013 0743   CREATININE 2.09 (H) 12/13/2017 0340   CREATININE 1.79 (H) 02/27/2013 0743   CALCIUM 9.1 12/13/2017 0340   CALCIUM 8.5 02/27/2013 0743   GFRNONAA 29 (L) 12/13/2017 0340   GFRNONAA 37 (L) 02/27/2013 5397  GFRAA 34 (L) 12/13/2017 0340   GFRAA 43 (L) 02/27/2013 0743   Estimated Creatinine Clearance: 31.6 mL/min (A) (by C-G formula based on SCr of 2.09 mg/dL (H)).  COAG Lab Results  Component Value Date   INR 1.65 12/10/2017   INR 1.2 02/27/2013   INR 1.1 07/16/2008    Radiology Ct Abdomen Pelvis Wo Contrast  Result Date: 12/13/2017 CLINICAL DATA:  Abdominal pain. Psoas abscess versus hematoma. Unable to lift right leg. EXAM: CT ABDOMEN AND PELVIS WITHOUT CONTRAST TECHNIQUE: Multidetector CT imaging of the abdomen and pelvis was performed following the standard protocol without IV contrast. COMPARISON:  Lumbar spine CT 12/13/2017. Right upper quadrant abdominal ultrasound 12/09/2017. FINDINGS: Lower chest: Small bilateral pleural effusions with dependent atelectasis in the lower lobes. Cardiomegaly. Partially visualized ICD leads. Hepatobiliary: 5.1 x 3.9 cm heterogeneously hypoattenuating mass in the lateral segment of the left hepatic lobe. Calcified stones in the gallbladder. Multiple calcified stones in the common bile duct including an 11 mm stone distally. Common bile duct dilatation with a diameter of 11 mm. Pancreas: No pancreatic ductal dilatation or inflammation. Spleen: Punctate calcified granulomas in the spleen. Adrenals/Urinary Tract: Mild nodular thickening of the right adrenal gland. Unremarkable left adrenal gland. 4 mm right upper pole renal calculus. Suspected vascular calcifications in both renal hila. No  hydronephrosis. Multiple small low-density lesions in both kidneys compatible with cysts. Small hyperdense lesions in both kidneys potentially reflecting proteinaceous or hemorrhagic cysts. 2.4 cm exophytic left lower pole renal lesion with intermediate attenuation. 2 cm left-sided bladder diverticulum. Stomach/Bowel: There is likely a small sliding hiatal hernia. There is an epigastric hernia containing a short segment of transverse colon without evidence of bowel obstruction or inflammation. Mild sigmoid colon diverticulosis is noted without evidence of diverticulitis. There is a diverticulum of the third portion of the duodenum without associated inflammation. The appendix is unremarkable. Vascular/Lymphatic: Prior aorto bi-iliac repair of an infrarenal abdominal aortic aneurysm aneurysmal dilatation of the more proximal abdominal aorta 3.6 cm diameter as described on earlier lumbar spine CT. 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 Reproductive: Heterogeneous, enlarged prostate indenting the bladder base. Other: Right iliopsoas hematoma measures 6.4 x 5.7 x 2.1 cm (transverse x AP x craniocaudal) with additional retroperitoneal stranding extending superiorly along the more proximal psoas muscle. Musculoskeletal: Grade 1 retrolisthesis of L4 on L5 with lumbar disc degeneration more fully evaluated on earlier CT. IMPRESSION: 1. 6.4 cm right retroperitoneal hematoma. 2. Cholelithiasis and choledocholithiasis with biliary dilatation. 3. 5.1 cm mass in the lateral segment of the left hepatic lobe, incompletely characterized on this noncontrast study. Recommend nonemergent contrast-enhanced abdominal MRI or CT for further evaluation. 4. Bilateral renal lesions likely largely reflecting cysts of varying complexity. 2.4 cm indeterminate left lower pole renal lesion could be further evaluated with contrast-enhanced abdominal MRI or renal mass protocol CT. 5. Small bilateral  pleural effusions. 6. Epigastric hernia containing a short segment of transverse colon. No bowel obstruction. 7. Prior infrarenal abdominal aortic aneurysm repair. 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 Aortic Atherosclerosis (ICD10-I70.0). Aortic aneurysm NOS (ICD10-I71.9). Electronically Signed   By: Logan Bores M.D.   On: 12/13/2017 19:29   Ct Lumbar Spine Wo Contrast  Result Date: 12/13/2017 CLINICAL DATA:  77 y/o  M; right anterior hip pain and claudication. EXAM: CT LUMBAR SPINE WITHOUT CONTRAST TECHNIQUE: Multidetector CT imaging of the lumbar spine was performed without intravenous contrast administration. Multiplanar CT image reconstructions were  also generated. COMPARISON:  12/09/2017 abdominal ultrasound FINDINGS: Segmentation: 5 lumbar type vertebrae. Alignment: Straightening of lumbar lordosis. L4-5 grade 1 retrolisthesis. Vertebrae: No acute fracture or focal pathologic process. Paraspinal and other soft tissues: Multiple simple and hemorrhagic cysts of the kidneys bilaterally. 26 mm indeterminate lesion of the lower pole of left kidney measuring 38 HU. Small right pleural effusion. Abdominal aortic calcific atherosclerosis. Abdominal aortic aneurysm measuring up to 3.6 cm. Edema within the right retroperitoneum along the course of iliopsoas muscles, partially visualized. Disc levels: Mild L3-4, moderate L4-5, mild L5-S1 loss of intervertebral disc space height and discogenic degenerative changes. Disc and facet degenerative changes result in mild foraminal stenosis bilaterally at the L4-5 and L5-S1 levels. No high-grade canal stenosis. IMPRESSION: 1. No acute fracture or dislocation of the lumbar spine. 2. Edema within the right retroperitoneum along the course of iliopsoas muscles, partially visualized. This may represent retroperitoneal hematoma, trauma, or myositis. 3. Abdominal aortic aneurysm measuring up to 3.6 cm. Recommend followup by ultrasound in 2  years. This recommendation follows ACR consensus guidelines: White Paper of the ACR Incidental Findings Committee II on Vascular Findings. J Am Coll Radiol 2013; 10:789-794. 4. Small right pleural effusion. 5. 26 mm indeterminate lesion of lower pole of left kidney measuring 38 HU. Further evaluation with renal protocol CT or MRI is recommended. Electronically Signed   By: Kristine Garbe M.D.   On: 12/13/2017 16:01   Nm Hepatobiliary Liver Func  Result Date: 12/11/2017 CLINICAL DATA:  Left upper quadrant pain traveling up to right upper quadrant. Cholelithiasis/sludge on recent ultrasound. EXAM: NUCLEAR MEDICINE HEPATOBILIARY IMAGING TECHNIQUE: Sequential images of the abdomen were obtained out to 60 minutes following intravenous administration of radiopharmaceutical. RADIOPHARMACEUTICALS:  7.91 mCi Tc-67m  Choletec IV COMPARISON:  None. FINDINGS: Prompt uptake and biliary excretion of activity by the liver is seen. Gallbladder activity is visualized at 50-60 minutes, consistent with patency of cystic duct. Biliary to bowel transit of radiotracer is not well visualized which may be within normal, although can be seen with common bile duct obstruction or due to morphine effect. Note that 3 mg of morphine was given after 1 hour of imaging due to questionable visualization of the gallbladder. 2 hour images confirm gallbladder visualization which was first visualized at 50-60 minutes as described above. IMPRESSION: No evidence of acute cholecystitis. No definite visualization of biliary to bowel transit of radiotracer which may be within normal although can be seen with common duct obstruction or effect of morphine administration. Electronically Signed   By: Marin Olp M.D.   On: 12/11/2017 10:10   Dg Chest Portable 1 View  Result Date: 12/09/2017 CLINICAL DATA:  Nausea, vomiting, and abdominal pain. EXAM: PORTABLE CHEST 1 VIEW COMPARISON:  February 27, 2013 FINDINGS: A calcified nodule in the right  base remains. No pneumothorax. No uncalcified nodules or masses. No suspicious infiltrates. Cardiomegaly. Stable AICD device. No other acute abnormalities. IMPRESSION: No active disease. Electronically Signed   By: Dorise Bullion III M.D   On: 12/09/2017 18:48   Dg Hip Unilat With Pelvis 2-3 Views Right  Result Date: 12/12/2017 CLINICAL DATA:  Onset of right hip pain today with no known injury. History of CHF and coronary artery disease and lung malignancy. EXAM: DG HIP (WITH OR WITHOUT PELVIS) 2-3V RIGHT COMPARISON:  None. FINDINGS: The bony pelvis is subjectively mildly osteopenic. There is no lytic nor blastic lesion. There are degenerative changes of the lower lumbar disc levels. AP and lateral views of the right hip  reveal moderate symmetric joint space loss. The articular surfaces of the right femoral head and acetabulum remains smoothly rounded. The femoral neck, intertrochanteric, and subtrochanteric regions are normal. 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 No acute bony abnormality. Electronically Signed   By: David  Martinique M.D.   On: 12/12/2017 13:24   US Abdomen Limited Ruq  Result Date: 12/09/2017 CLINICAL DATA:  Transaminitis with abdominal pain. Nausea and vomiting for 1 hour. History of lung cancer. EXAM: ULTRASOUND ABDOMEN LIMITED RIGHT UPPER QUADRANT COMPARISON:  None. FINDINGS: Gallbladder: There are several non mobile stones near the neck of the gallbladder. The gallbladder wall is borderline to mildly thickened measuring 3.4 mm. No Murphy's sign or pericholecystic fluid. Gallbladder sludge noted. Common bile duct: Diameter: 7.4 mm Liver: There is a hyperechoic mass in the left hepatic lobe measuring 2.9 x 2.6 x 2.4 cm. Intrahepatic ductal dilatation is noted. Other: Right pleural effusion. Portal vein is patent on color Doppler imaging with normal direction of blood flow towards the liver. IMPRESSION: 1. Non mobile stones near the neck of the gallbladder.  The gallbladder wall is borderline to mildly thickened. Sludge. No Murphy's sign or pericholecystic fluid. A HIDA scan could further evaluate if there is continued concern for acute cholecystitis. 2. The common bile duct is mildly dilated measuring 7.4 mm. There is also some intrahepatic ductal dilatation. Recommend correlation with labs. An MRCP could better evaluate the intra and extrahepatic bile ducts. 3. There is a hyperechoic mass in the left hepatic lobe measuring up to 2.9 cm. While a hemangioma is possible, the mass is nonspecific and neoplasm/malignancy not excluded. Recommend an MRI of this mass to further characterize. The MRI of this mass could occur as an outpatient. 4. Right pleural effusion. Electronically Signed   By: Dorise Bullion III M.D   On: 12/09/2017 20:00     Assessment/Plan 1.  Right Psoas Hematoma: No surgical intervention at this time.  Continue to follow H/H.  Avoid anticoagulants.  No contraindication to transfer given his cardiac issues   Hortencia Pilar, MD  12/13/2017 8:55 PM

## 2017-12-14 ENCOUNTER — Ambulatory Visit (HOSPITAL_COMMUNITY)
Admission: AD | Admit: 2017-12-14 | Discharge: 2017-12-14 | Disposition: A | Discharge status: Home / Self Care | Payer: No Typology Code available for payment source | Source: Other Acute Inpatient Hospital | Attending: Internal Medicine | Admitting: Internal Medicine

## 2017-12-14 DIAGNOSIS — R579 Shock, unspecified: Secondary | ICD-10-CM | POA: Insufficient documentation

## 2017-12-14 LAB — CBC
HEMATOCRIT: 26.7 % — AB (ref 39.0–52.0)
Hemoglobin: 8.4 g/dL — ABNORMAL LOW (ref 13.0–17.0)
MCH: 31 pg (ref 26.0–34.0)
MCHC: 31.5 g/dL (ref 30.0–36.0)
MCV: 98.5 fL (ref 80.0–100.0)
NRBC: 0 % (ref 0.0–0.2)
PLATELETS: 116 10*3/uL — AB (ref 150–400)
RBC: 2.71 MIL/uL — AB (ref 4.22–5.81)
RDW: 14.3 % (ref 11.5–15.5)
WBC: 3.4 10*3/uL — ABNORMAL LOW (ref 4.0–10.5)

## 2017-12-14 LAB — COMPREHENSIVE METABOLIC PANEL
ALBUMIN: 3 g/dL — AB (ref 3.5–5.0)
ALK PHOS: 210 U/L — AB (ref 38–126)
ALT: 50 U/L — AB (ref 0–44)
AST: 35 U/L (ref 15–41)
Anion gap: 5 (ref 5–15)
BUN: 23 mg/dL (ref 8–23)
CO2: 30 mmol/L (ref 22–32)
CREATININE: 2.27 mg/dL — AB (ref 0.61–1.24)
Calcium: 9 mg/dL (ref 8.9–10.3)
Chloride: 104 mmol/L (ref 98–111)
GFR calc non Af Amer: 26 mL/min — ABNORMAL LOW (ref >60)
GFR, EST AFRICAN AMERICAN: 31 mL/min — AB (ref >60)
GLUCOSE: 100 mg/dL — AB (ref 70–99)
Potassium: 5 mmol/L (ref 3.5–5.1)
SODIUM: 139 mmol/L (ref 135–145)
Total Bilirubin: 1.7 mg/dL — ABNORMAL HIGH (ref 0.3–1.2)
Total Protein: 6 g/dL — ABNORMAL LOW (ref 6.5–8.1)

## 2017-12-14 LAB — HEMOGLOBIN AND HEMATOCRIT, BLOOD
HEMATOCRIT: 28.3 % — AB (ref 39.0–52.0)
Hemoglobin: 8.7 g/dL — ABNORMAL LOW (ref 13.0–17.0)

## 2017-12-14 MED ORDER — ALUM & MAG HYDROXIDE-SIMETH 200-200-20 MG/5ML PO SUSP: 15.0000 mL | Freq: Four times a day (QID) | ORAL | Status: DC | PRN

## 2017-12-14 NOTE — Care Management (Signed)
Spoke with Tanya at the [place].  SHe needs an updated progress note from the MD stating patient is stable for transfer.  Notified Dr. Anselm Jungling.

## 2017-12-14 NOTE — Progress Notes (Signed)
Report called to Crista Elliot, RN at the [facility]. [facility] called for transport. Family at bedside aware of plan.

## 2017-12-14 NOTE — Care Management (Signed)
Spoke with Lavella Lemons at the [facility] transfer center.  She received the faxed progress note.  Bed assignment is 7B.  Unit phone number given to Lovena Le, RN for report and [facility] will be notified of need for transport.

## 2017-12-14 NOTE — Care Management (Signed)
Received notice from Dr. Anselm Jungling that patient is now stable for transfer.  [place] and left a message with the transfer department.  Waiting on a call back.

## 2017-12-14 NOTE — Progress Notes (Signed)
Attempted to call report to [place]. RN will call me back.

## 2017-12-14 NOTE — Progress Notes (Signed)
Pt's Hb stable. CT angio abd confirms hematoma retroperitoneum. Seen by vascular surgery- Advised to monitor Off anticoagulants.  I called Dr.Gurnick from [place] again today, he accepted pt now for transfer. Pt is stable for transfer today.

## 2017-12-14 NOTE — Discharge Summary (Signed)
[facility] NAME: [name]    MR#:  [id number]  DATE OF BIRTH:  01/21/1941  DATE OF ADMISSION:  12/09/2017 ADMITTING PHYSICIAN: Lance Coon, MD  DATE OF DISCHARGE: 12/14/2017   PRIMARY CARE PHYSICIAN: Burnell Blanks, MD    ADMISSION DIAGNOSIS:  Hypomagnesemia [E83.42] V-tach ([place]) [I47.2] Transaminitis [R74.0] Near syncope [R55] Nausea and vomiting [R11.2] Abdominal pain [R10.9] Calculus of gallbladder with biliary obstruction but without cholecystitis [K80.21]  DISCHARGE DIAGNOSIS:  Principal Problem:   Defibrillator discharge Active Problems:   CAD (coronary artery disease)   HTN (hypertension)   GERD (gastroesophageal reflux disease)   COPD (chronic obstructive pulmonary disease) (HCC)   Chronic combined systolic and diastolic CHF (congestive heart failure) ([place])   SECONDARY DIAGNOSIS:   Past Medical History:  Diagnosis Date  . AAA (abdominal aortic aneurysm) ([place])   . Cancer ([place])   . CHF (congestive heart failure) ([place])   . COPD (chronic obstructive pulmonary disease) ([place])   . GERD (gastroesophageal reflux disease)   . HTN (hypertension)   . Hx of CABG   . Lung cancer [facility])     HOSPITAL COURSE:   * Defibrillator discharge: AICD shocked patient in ED once, pacemaker interrogated by [employer]. Current rhythm A-fib, rate controlled post Amio bolus. Patient admitted to Telemetry with continuous cardiac monitoring, Cardiology consulted, echocardiogram ordered.  Cardio suggest no medication change but continue with transferring to [place]. Initially the plan was discharging home from here and not transferred to [place]. Cardiology strongly suggested to transfer to [facility] today as he would need interrogation and may be change in the setting of his pacemaker along with possible catheterization.  [facility] called again for need of transfer.  Accepted by Dr.Geurink, Marylyn Ishihara  * Right thigh pain- psoas  hematoma   Xray pelvis/ hip was negative.    Did CT lumber spine- showed possible edema/  Hematoma on right ileopsoas.   Vital stable, stat CBC, Vascular consult.    Stopped heparine drip.   CT abd angiogram done, no intervention , vascular suggest hold anticoagulant and monitor HB.    Hb stable > 8 on last 3 checks.   Pain management as needed.   Discussed with pt's daughter in law and grand daughter about this.  * Cholelithiasis: HIDA scan ordered, based on results with consult surgery versus GI.  HIDA scan is done and does not show acute cholecystitis. Patient's LFT and bilirubin is improving.  And he is tolerating diet without any difficulties or nausea. He would not need any further interventions for this.  * Chronic atrial Fibrillation: Patient Xarelto discontinued due to reduced GFR and anticipation of procedure, pharmacy consulted for heparin gtt. Home antiarrhythmicContinued.  Stopped drip due to concern of hematoma  * Acute on Chronic Kidney Injury:  CKD stage IV Likely related to cardiorenal syndrome, though some dehydration given his recent vomiting could be a contributing factor. We will not administer IVF due to his historically very low EF. Avoid nephrotoxins and monitor.  *CAD (coronary artery disease): home medications continued  hold ASA due to possible hematoma.  *HTN (hypertension): home medications continued  *COPD (chronic obstructive pulmonary disease) ([place]): home medications continued  *Chronic combined systolic and diastolic CHF (congestive heart failure) ([place]): home medications continued  *GERD (gastroesophageal reflux disease): PPI ordered, zofran PRN given  DISCHARGE CONDITIONS:   Stable.  CONSULTS OBTAINED:  Treatment Team:  Sherren Mocha, MD Wellington Hampshire, MD Alexis Goodell, MD [place],  Dolores Lory, MD  DRUG ALLERGIES:   Allergies  Allergen Reactions  . Dofetilide Other (See Comments)    Reaction: Syncope   . Amiodarone Other (See Comments)    Reaction: weakness    DISCHARGE MEDICATIONS:   Allergies as of 12/14/2017      Reactions   Dofetilide Other (See Comments)   Reaction: Syncope   Amiodarone Other (See Comments)   Reaction: weakness      Medication List    STOP taking these medications   Rivaroxaban 15 MG Tabs tablet Commonly known as:  XARELTO     TAKE these medications   atorvastatin 40 MG tablet Commonly known as:  LIPITOR Take 0.5 tablets by mouth at bedtime.   finasteride 5 MG tablet Commonly known as:  PROSCAR Take 1 tablet by mouth daily.   furosemide 20 MG tablet Commonly known as:  LASIX Take 20 mg by mouth 2 (two) times daily as needed for fluid.   metoprolol succinate 25 MG 24 hr tablet Commonly known as:  TOPROL-XL Take 37.5 mg by mouth at bedtime.   mexiletine 150 MG capsule Commonly known as:  MEXITIL Take 1 capsule by mouth 3 (three) times daily.   midodrine 5 MG tablet Commonly known as:  PROAMATINE Take 5 mg by mouth 3 (three) times daily.   nitroGLYCERIN 0.4 MG SL tablet Commonly known as:  NITROSTAT Place 0.4 mg under the tongue every 5 (five) minutes as needed for chest pain.   omeprazole 20 MG capsule Commonly known as:  PRILOSEC Take 1 capsule by mouth 2 (two) times daily.   oxyCODONE 5 MG immediate release tablet Commonly known as:  Oxy IR/ROXICODONE Take 1 tablet (5 mg total) by mouth every 6 (six) hours as needed for moderate pain.   STIOLTO RESPIMAT 2.5-2.5 MCG/ACT Aers Generic drug:  Tiotropium Bromide-Olodaterol Inhale 2 puffs into the lungs daily.   tamsulosin 0.4 MG Caps capsule Commonly known as:  FLOMAX Take 1 capsule by mouth at bedtime.        DISCHARGE INSTRUCTIONS:    Follow as advised by [facility] cardiology team.  If you experience worsening of your admission symptoms, develop shortness of breath, life threatening emergency, suicidal or homicidal thoughts you must seek medical attention immediately by calling  911 or calling your MD immediately  if symptoms less severe.  You Must read complete instructions/literature along with all the possible adverse reactions/side effects for all the Medicines you take and that have been prescribed to you. Take any new Medicines after you have completely understood and accept all the possible adverse reactions/side effects.   Please note  You were cared for by a hospitalist during your hospital stay. If you have any questions about your discharge medications or the care you received while you were in the hospital after you are discharged, you can call the unit and asked to speak with the hospitalist on call if the hospitalist that took care of you is not available. Once you are discharged, your primary care physician will handle any further medical issues. Please note that NO REFILLS for any discharge medications will be authorized once you are discharged, as it is imperative that you return to your primary care physician (or establish a relationship with a primary care physician if you do not have one) for your aftercare needs so that they can reassess your need for medications and monitor your lab values.    Today   CHIEF COMPLAINT:   Chief Complaint  Patient presents with  .  Nausea    HISTORY OF PRESENT ILLNESS:  Ephriam Turman  is a 77 y.o. male with a known history of significant cardiac history post CABG, with CHF post AICD and a AAA presented with nausea, vomiting and weakness.  Upon arrival at the ED the patient went into ventricular fibrillation and was shocked by his AICD converting him back to Atrial fibrillation which is the patient's reported chronic rhythm.  Patient reported becoming nauseous and vomiting 3-4 times this afternoon 2-3 hours after eating a cheese dog.  Patient also reports feeling a "fire" across his lower abdomen, with chills, fatigue and increased thirst.  This is the third episode like this the patient reports having, in previous  episodes he was unable to tolerate any type of food for 1-2 days before it went away.  Patient denies fever/chest pain/diaphoresis.  Patient denies blood in his emesis, but reports having a "darker than usual" stool today.  Patient reports having increased dyspnea overnight and in the early morning, patient used his home inhalers with limited success.  Patient is currently being treated for lung cancer with radiation treatment, most recent course completed 1 week ago.  Abdominal US positive for sludge and a mildly dilated common bile duct in gallbladder with multiple stones, as well as a 2.9 cm mass on the left hepatic lobe.  Liver enzymes and Cr. are markedly elevated from baseline. Hospitalist consulted for admission.   VITAL SIGNS:  Blood pressure 116/68, pulse 62, temperature 98.5 F (36.9 C), temperature source Oral, resp. rate 18, height 6' (1.829 m), weight 74.2 kg, SpO2 96 %.  I/O:    Intake/Output Summary (Last 24 hours) at 12/14/2017 1201 Last data filed at 12/14/2017 0425 Gross per 24 hour  Intake -  Output 820 ml  Net -820 ml    PHYSICAL EXAMINATION:  GENERAL:  77 y.o.-year-old patient lying in the bed with no acute distress.  EYES: Pupils equal, round, reactive to light and accommodation. No scleral icterus. Extraocular muscles intact.  HEENT: Head atraumatic, normocephalic. Oropharynx and nasopharynx clear.  NECK:  Supple, no jugular venous distention. No thyroid enlargement, no tenderness.  LUNGS: Normal breath sounds bilaterally, no wheezing, rales,rhonchi or crepitation. No use of accessory muscles of respiration.  CARDIOVASCULAR: S1, S2 normal. No murmurs, rubs, or gallops. AICD in place. ABDOMEN: Soft, nontender, nondistended. Bowel sounds present. No organomegaly or mass.  EXTREMITIES: No pedal edema, cyanosis, or clubbing.  Tender on the right thigh on local pressure but able to move distal feet and knee. NEUROLOGIC: Cranial nerves II through XII are intact. Muscle  strength 5/5 in all extremities. Sensation intact. Gait not checked.  PSYCHIATRIC: The patient is alert and oriented x 3.  SKIN: No obvious rash, lesion, or ulcer.   DATA REVIEW:   CBC Recent Labs  Lab 12/14/17 0353  WBC 3.4*  HGB 8.4*  HCT 26.7*  PLT 116*    Chemistries  Recent Labs  Lab 12/12/17 0412  12/14/17 0353  NA 140   < > 139  K 4.3   < > 5.0  CL 105   < > 104  CO2 30   < > 30  GLUCOSE 105*   < > 100*  BUN 27*   < > 23  CREATININE 2.26*   < > 2.27*  CALCIUM 8.6*   < > 9.0  MG 2.3  --   --   AST 60*   < > 35  ALT 81*   < > 50*  ALKPHOS 242*   < >  210*  BILITOT 1.2   < > 1.7*   < > = values in this interval not displayed.    Cardiac Enzymes Recent Labs  Lab 12/10/17 1232  TROPONINI 0.09*    Microbiology Results  No results found for this or any previous visit.  RADIOLOGY:  Ct Abdomen Pelvis Wo Contrast  Result Date: 12/13/2017 CLINICAL DATA:  Abdominal pain. Psoas abscess versus hematoma. Unable to lift right leg. EXAM: CT ABDOMEN AND PELVIS WITHOUT CONTRAST TECHNIQUE: Multidetector CT imaging of the abdomen and pelvis was performed following the standard protocol without IV contrast. COMPARISON:  Lumbar spine CT 12/13/2017. Right upper quadrant abdominal ultrasound 12/09/2017. FINDINGS: Lower chest: Small bilateral pleural effusions with dependent atelectasis in the lower lobes. Cardiomegaly. Partially visualized ICD leads. Hepatobiliary: 5.1 x 3.9 cm heterogeneously hypoattenuating mass in the lateral segment of the left hepatic lobe. Calcified stones in the gallbladder. Multiple calcified stones in the common bile duct including an 11 mm stone distally. Common bile duct dilatation with a diameter of 11 mm. Pancreas: No pancreatic ductal dilatation or inflammation. Spleen: Punctate calcified granulomas in the spleen. Adrenals/Urinary Tract: Mild nodular thickening of the right adrenal gland. Unremarkable left adrenal gland. 4 mm right upper pole renal  calculus. Suspected vascular calcifications in both renal hila. No hydronephrosis. Multiple small low-density lesions in both kidneys compatible with cysts. Small hyperdense lesions in both kidneys potentially reflecting proteinaceous or hemorrhagic cysts. 2.4 cm exophytic left lower pole renal lesion with intermediate attenuation. 2 cm left-sided bladder diverticulum. Stomach/Bowel: There is likely a small sliding hiatal hernia. There is an epigastric hernia containing a short segment of transverse colon without evidence of bowel obstruction or inflammation. Mild sigmoid colon diverticulosis is noted without evidence of diverticulitis. There is a diverticulum of the third portion of the duodenum without associated inflammation. The appendix is unremarkable. Vascular/Lymphatic: Prior aorto bi-iliac repair of an infrarenal abdominal aortic aneurysm aneurysmal dilatation of the more proximal abdominal aorta 3.6 cm diameter as described on earlier lumbar spine CT. 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 Reproductive: Heterogeneous, enlarged prostate indenting the bladder base. Other: Right iliopsoas hematoma measures 6.4 x 5.7 x 2.1 cm (transverse x AP x craniocaudal) with additional retroperitoneal stranding extending superiorly along the more proximal psoas muscle. Musculoskeletal: Grade 1 retrolisthesis of L4 on L5 with lumbar disc degeneration more fully evaluated on earlier CT. IMPRESSION: 1. 6.4 cm right retroperitoneal hematoma. 2. Cholelithiasis and choledocholithiasis with biliary dilatation. 3. 5.1 cm mass in the lateral segment of the left hepatic lobe, incompletely characterized on this noncontrast study. Recommend nonemergent contrast-enhanced abdominal MRI or CT for further evaluation. 4. Bilateral renal lesions likely largely reflecting cysts of varying complexity. 2.4 cm indeterminate left lower pole renal lesion could be further evaluated with  contrast-enhanced abdominal MRI or renal mass protocol CT. 5. Small bilateral pleural effusions. 6. Epigastric hernia containing a short segment of transverse colon. No bowel obstruction. 7. Prior infrarenal abdominal aortic aneurysm repair. 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 Aortic Atherosclerosis (ICD10-I70.0). Aortic aneurysm NOS (ICD10-I71.9). Electronically Signed   By: Logan Bores M.D.   On: 12/13/2017 19:29   Ct Lumbar Spine Wo Contrast  Result Date: 12/13/2017 CLINICAL DATA:  77 y/o  M; right anterior hip pain and claudication. EXAM: CT LUMBAR SPINE WITHOUT CONTRAST TECHNIQUE: Multidetector CT imaging of the lumbar spine was performed without intravenous contrast administration. Multiplanar CT image reconstructions were also generated. COMPARISON:  12/09/2017  abdominal ultrasound FINDINGS: Segmentation: 5 lumbar type vertebrae. Alignment: Straightening of lumbar lordosis. L4-5 grade 1 retrolisthesis. Vertebrae: No acute fracture or focal pathologic process. Paraspinal and other soft tissues: Multiple simple and hemorrhagic cysts of the kidneys bilaterally. 26 mm indeterminate lesion of the lower pole of left kidney measuring 38 HU. Small right pleural effusion. Abdominal aortic calcific atherosclerosis. Abdominal aortic aneurysm measuring up to 3.6 cm. Edema within the right retroperitoneum along the course of iliopsoas muscles, partially visualized. Disc levels: Mild L3-4, moderate L4-5, mild L5-S1 loss of intervertebral disc space height and discogenic degenerative changes. Disc and facet degenerative changes result in mild foraminal stenosis bilaterally at the L4-5 and L5-S1 levels. No high-grade canal stenosis. IMPRESSION: 1. No acute fracture or dislocation of the lumbar spine. 2. Edema within the right retroperitoneum along the course of iliopsoas muscles, partially visualized. This may represent retroperitoneal hematoma, trauma, or myositis. 3. Abdominal  aortic aneurysm measuring up to 3.6 cm. Recommend followup by ultrasound in 2 years. This recommendation follows ACR consensus guidelines: White Paper of the ACR Incidental Findings Committee II on Vascular Findings. J Am Coll Radiol 2013; 10:789-794. 4. Small right pleural effusion. 5. 26 mm indeterminate lesion of lower pole of left kidney measuring 38 HU. Further evaluation with renal protocol CT or MRI is recommended. Electronically Signed   By: Kristine Garbe M.D.   On: 12/13/2017 16:01   Dg Hip Unilat With Pelvis 2-3 Views Right  Result Date: 12/12/2017 CLINICAL DATA:  Onset of right hip pain today with no known injury. History of CHF and coronary artery disease and lung malignancy. EXAM: DG HIP (WITH OR WITHOUT PELVIS) 2-3V RIGHT COMPARISON:  None. FINDINGS: The bony pelvis is subjectively mildly osteopenic. There is no lytic nor blastic lesion. There are degenerative changes of the lower lumbar disc levels. AP and lateral views of the right hip reveal moderate symmetric joint space loss. The articular surfaces of the right femoral head and acetabulum remains smoothly rounded. The femoral neck, intertrochanteric, and subtrochanteric regions are normal. 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 No acute bony abnormality. Electronically Signed   By: David  Martinique M.D.   On: 12/12/2017 13:24    EKG:   Orders placed or performed during the hospital encounter of 12/09/17  . EKG 12-Lead  . EKG 12-Lead      Management plans discussed with the patient, family and they are in agreement.  CODE STATUS: Full.    Code Status Orders  (From admission, onward)         Start     Ordered   12/10/17 0039  Full code  Continuous     12/10/17 0038        Code Status History    This patient has a current code status but no historical code status.    Advance Directive Documentation     Most Recent Value  Type of Advance Directive  Healthcare Power of  Attorney, Living will  Pre-existing out of facility DNR order (yellow form or pink MOST form)  -  "MOST" Form in Place?  -      TOTAL TIME TAKING CARE OF THIS PATIENT: 45 minutes.    Vaughan Basta M.D on 12/14/2017 at 12:01 PM  Between 7am to 6pm - Pager - [phone]  After 6pm go to [email or web address] - password [email or web address]  Office  [phone]  CC: Primary care physician; Burnell Blanks, MD   Note: This dictation was  prepared with Dragon dictation along with smaller phrase technology. Any transcriptional errors that result from this process are unintentional.

## 2018-01-18 ENCOUNTER — Ambulatory Visit: Payer: No Typology Code available for payment source | Attending: Radiation Oncology | Admitting: Radiation Oncology

## 2018-02-07 DEATH — deceased

## 2019-03-03 IMAGING — US US ABDOMEN LIMITED
1 series · 13 of 25 positions shown · non-contrast
Comparison: None.

CLINICAL DATA: Transaminitis with abdominal pain. Nausea and
vomiting for 1 hour. History of lung cancer.

EXAM:
ULTRASOUND ABDOMEN LIMITED RIGHT UPPER QUADRANT

[Series 1: us abdomen limited · 57 acquisitions, 13 frames shown]
[im 1/57]
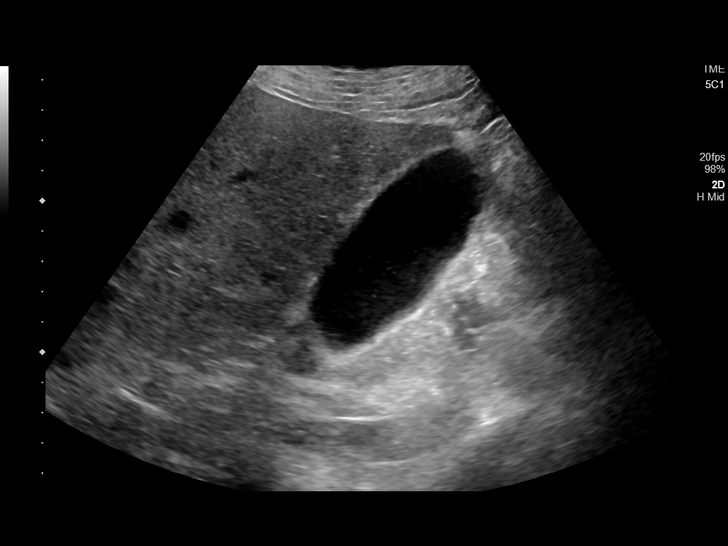
[im 5/57]
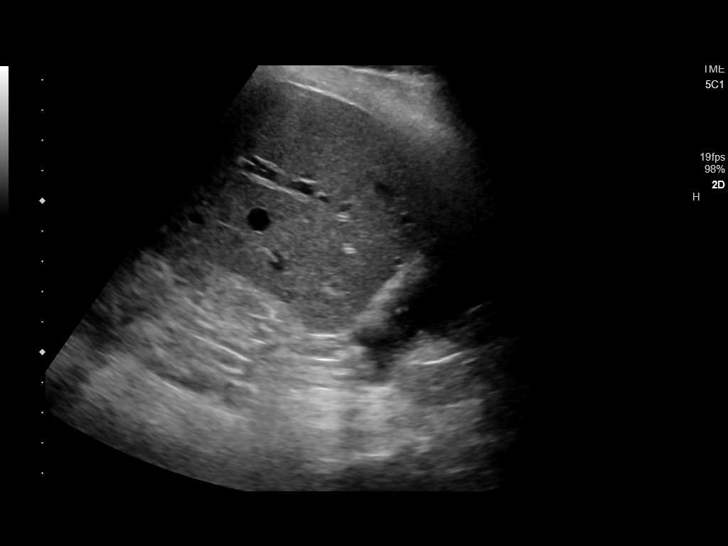
[im 10/57]
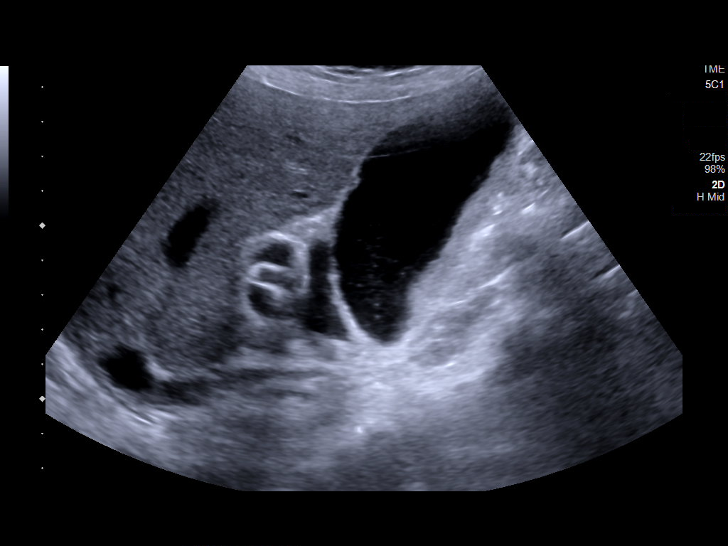
[im 15/57]
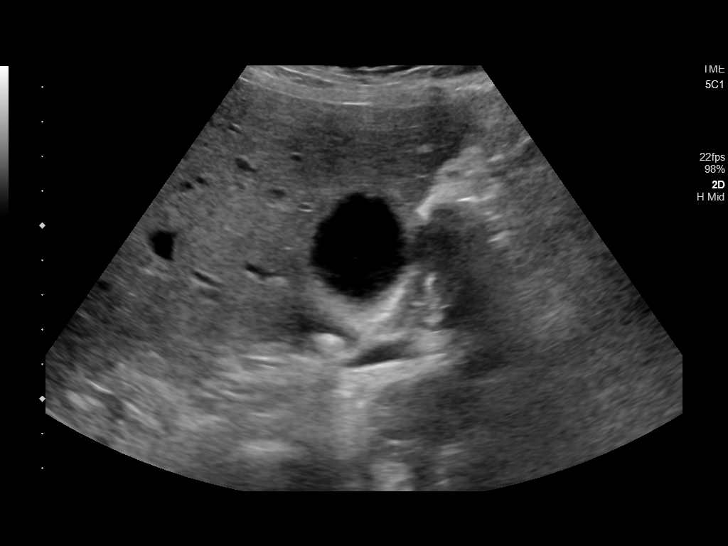
[im 19/57]
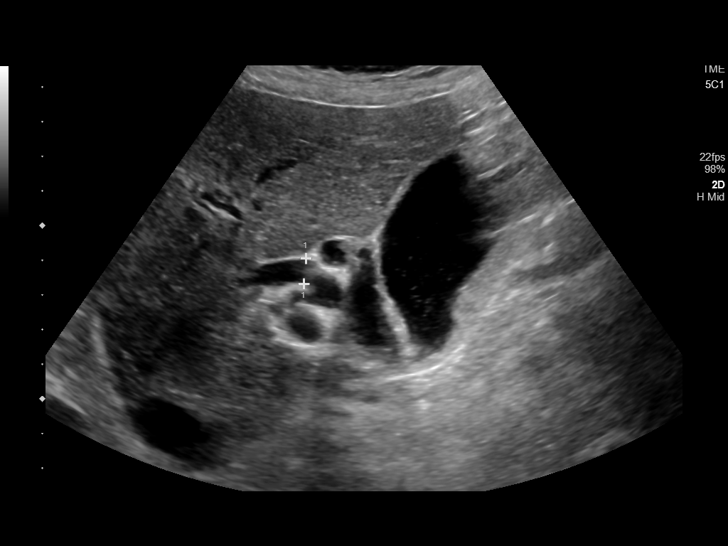
[im 24/57]
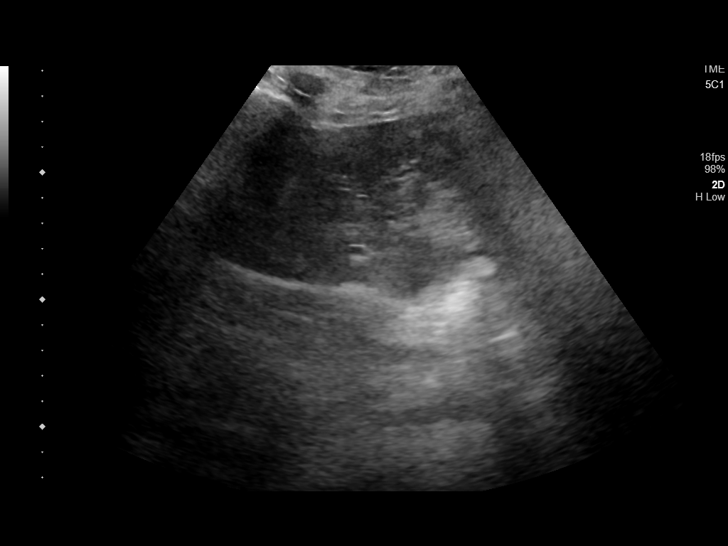
[im 29/57]
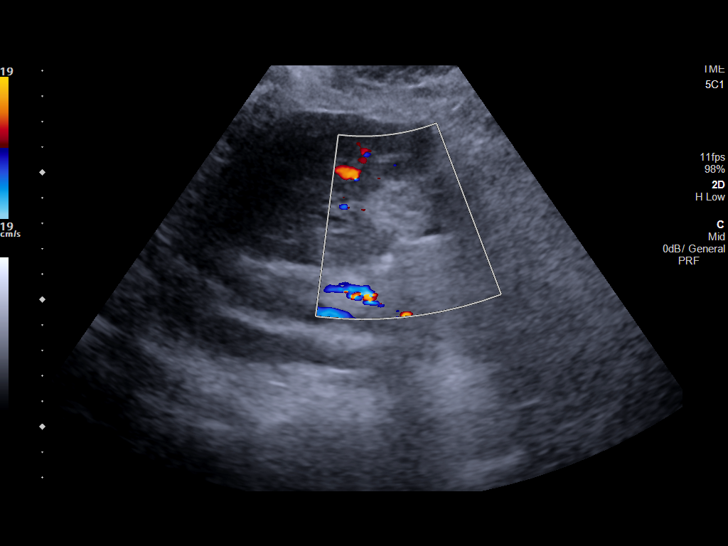
[im 33/57]
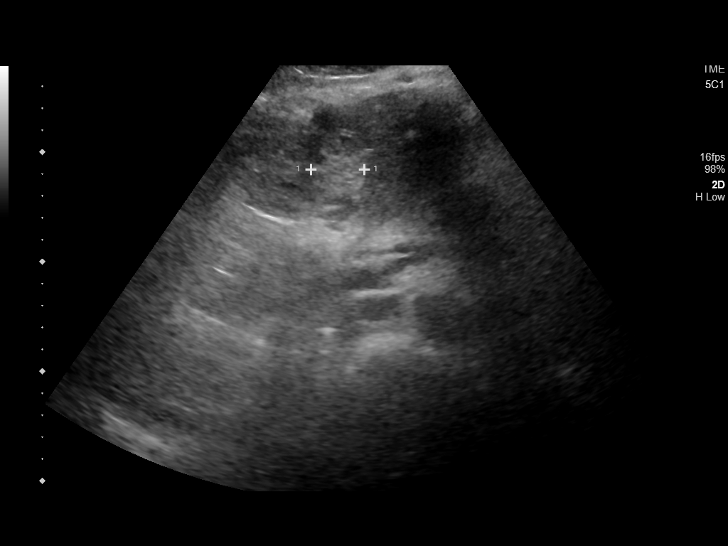
[im 38/57]
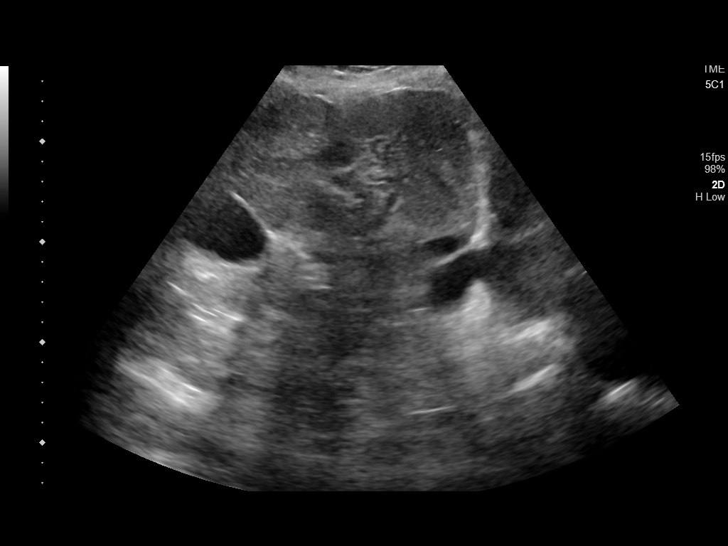
[im 43/57]
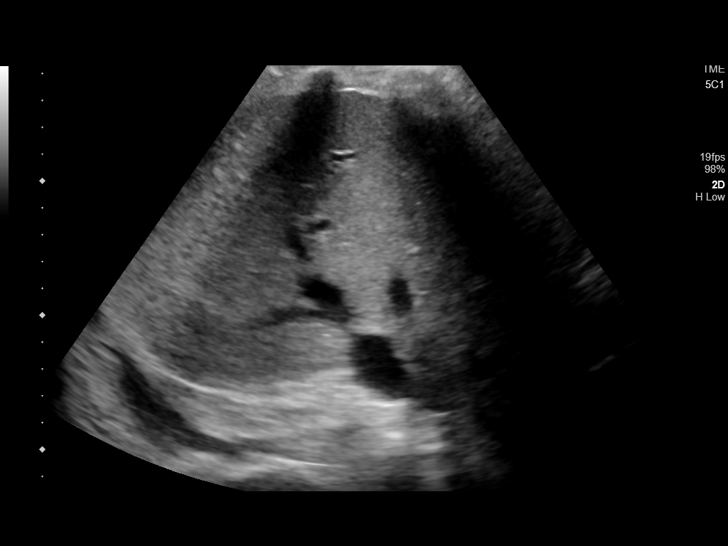
[im 47/57]
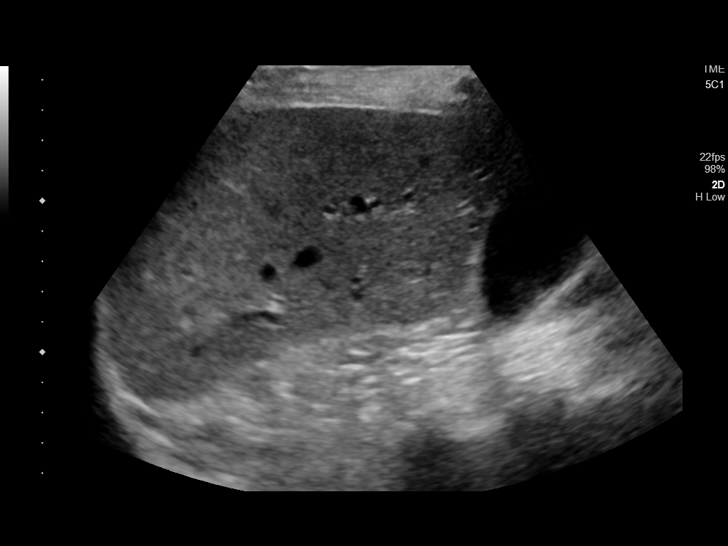
[im 52/57]
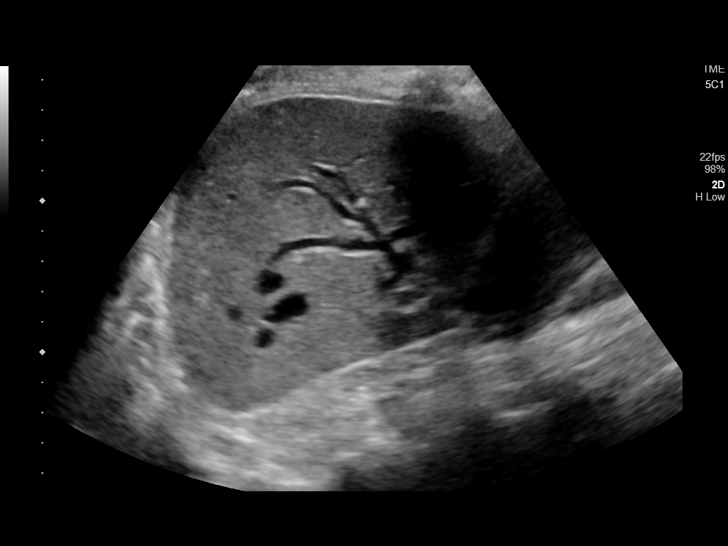
[im 57/57]
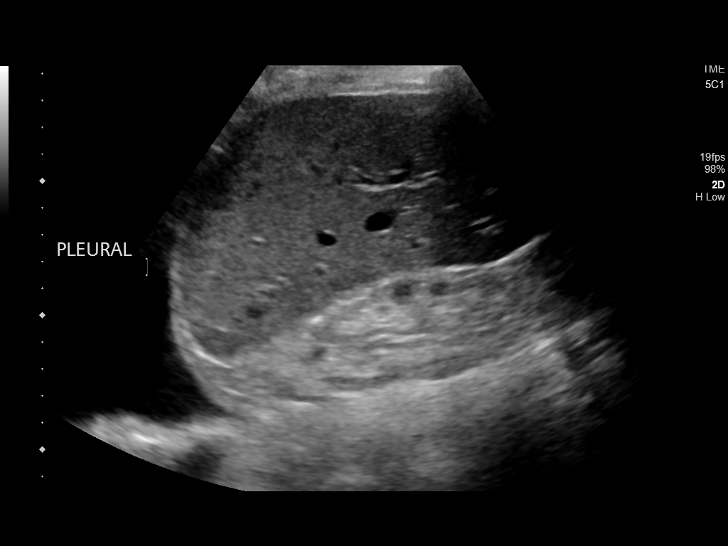

[13 of 25 positions shown; findings below may reference images not displayed]

FINDINGS: Gallbladder:

There are several non mobile stones near the neck of the
gallbladder. The gallbladder wall is borderline to mildly thickened
measuring 3.4 mm. No Murphy's sign or pericholecystic fluid.
Gallbladder sludge noted.

Common bile duct:

Diameter: 7.4 mm

Liver:

There is a hyperechoic mass in the left hepatic lobe measuring 2.9 x
2.6 x 2.4 cm. Intrahepatic ductal dilatation is noted.

Other: Right pleural effusion. Portal vein is patent on color
Doppler imaging with normal direction of blood flow towards the
liver.
IMPRESSION: 1. Non mobile stones near the neck of the gallbladder. The
gallbladder wall is borderline to mildly thickened. Sludge. No
Murphy's sign or pericholecystic fluid. A HIDA scan could further
evaluate if there is continued concern for acute cholecystitis.
2. The common bile duct is mildly dilated measuring 7.4 mm. There is
also some intrahepatic ductal dilatation. Recommend correlation with
labs. An MRCP could better evaluate the intra and extrahepatic bile
ducts.
3. There is a hyperechoic mass in the left hepatic lobe measuring up
to 2.9 cm. While a hemangioma is possible, the mass is nonspecific
and neoplasm/malignancy not excluded. Recommend an MRI of this mass
to further characterize. The MRI of this mass could occur as an
outpatient.
4. Right pleural effusion.

## 2019-03-03 IMAGING — DX DG CHEST 1V PORT
2 series · 2 of 2 positions shown · non-contrast
Comparison: February 27, 2013

CLINICAL DATA: Nausea, vomiting, and abdominal pain.

EXAM:
PORTABLE CHEST 1 VIEW

[chest ap (1 of 2)]
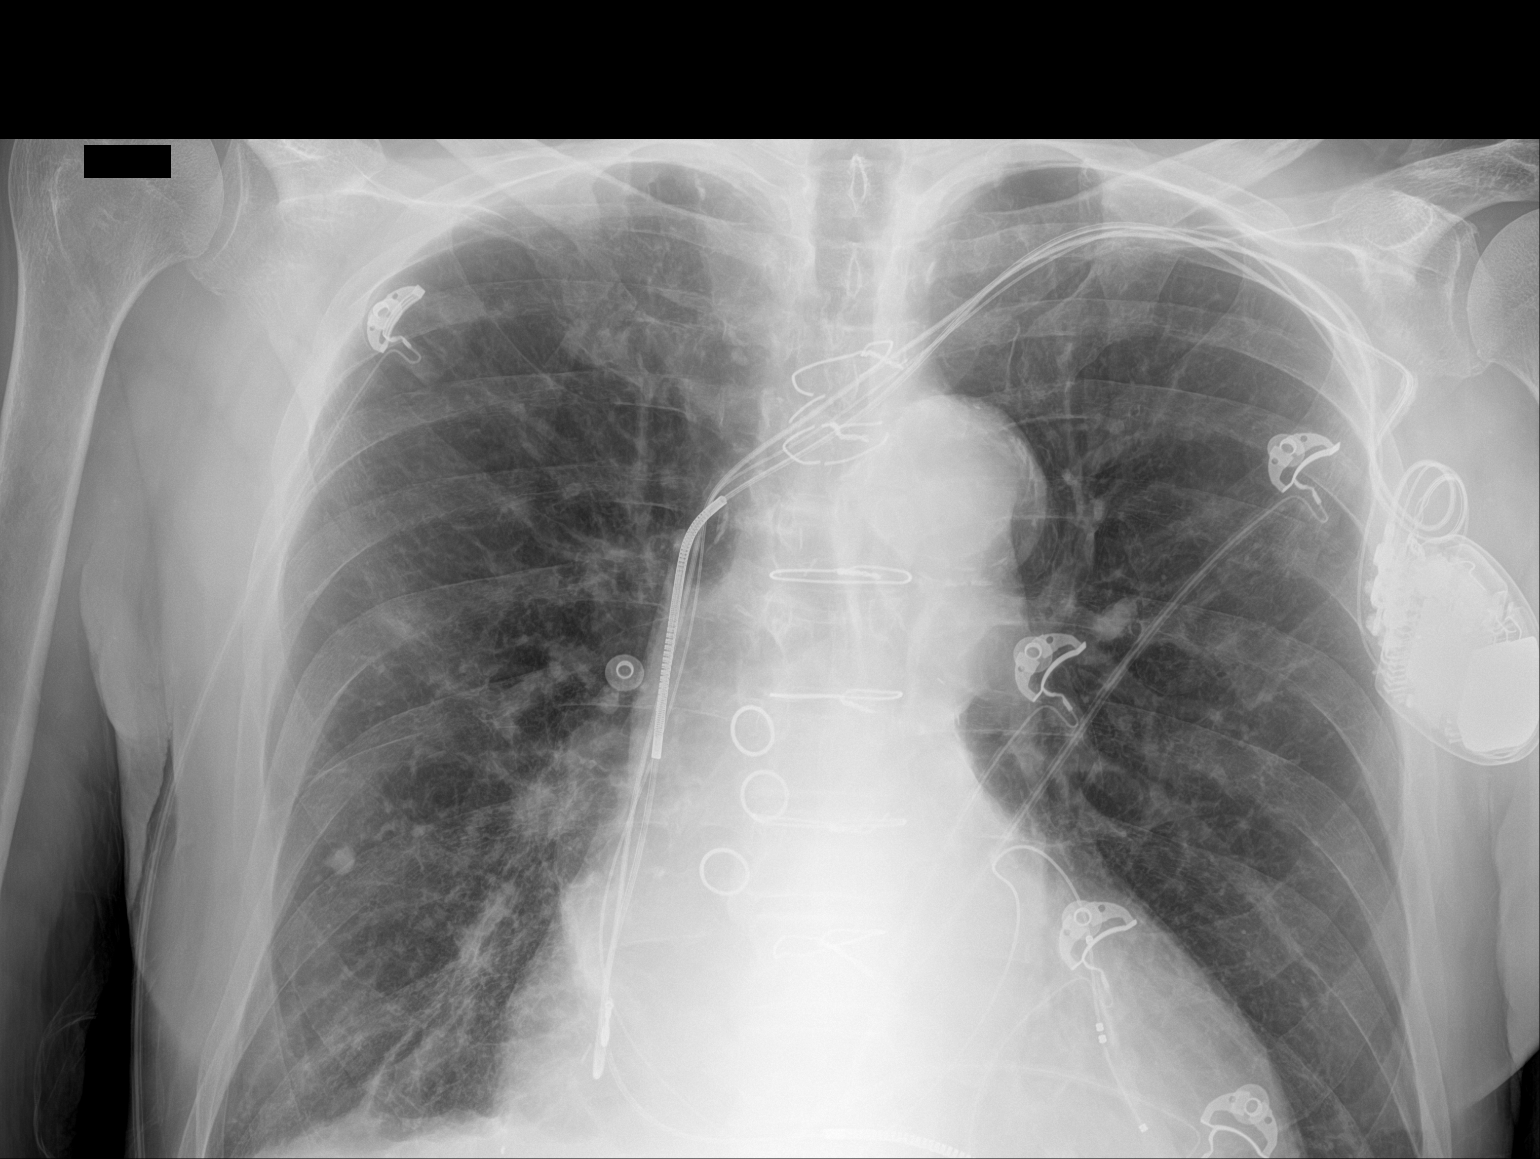

[chest ap (2 of 2)]
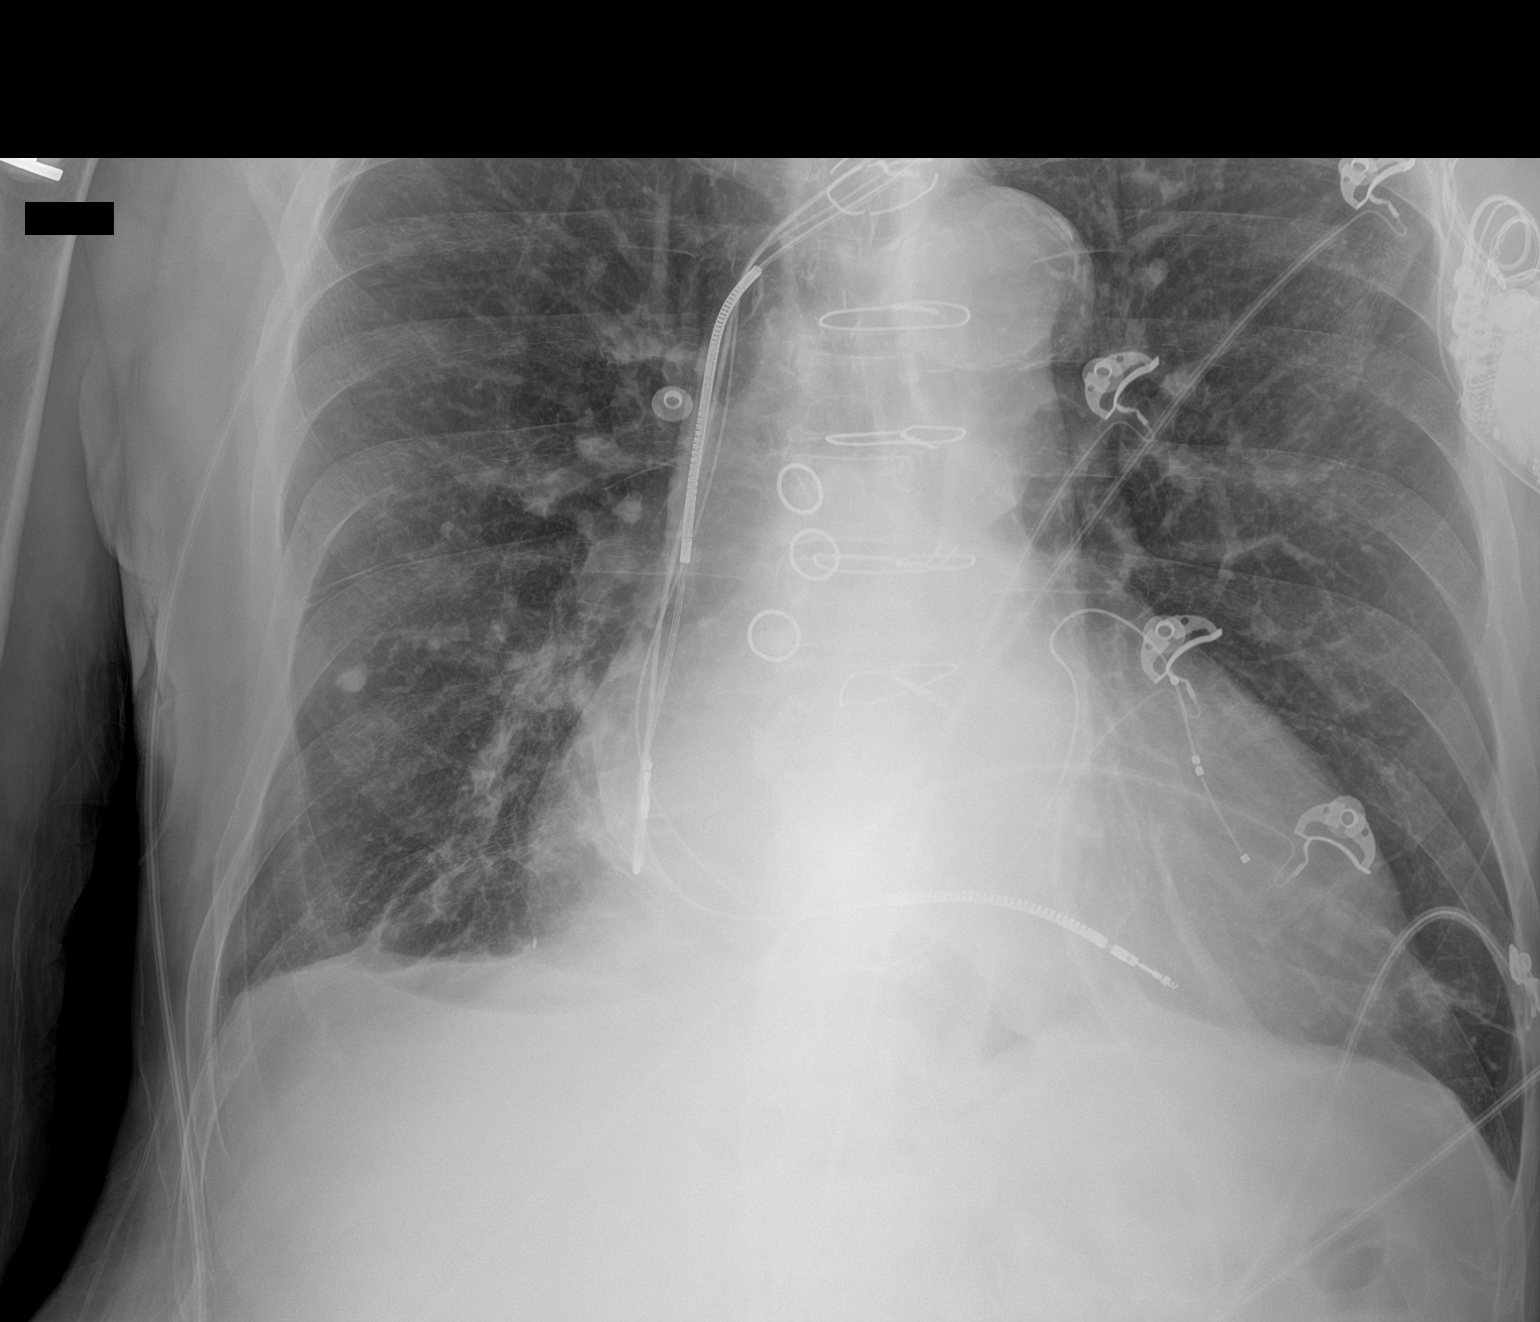

[2 of 2 positions shown; findings below may reference images not displayed]

FINDINGS: A calcified nodule in the right base remains. No pneumothorax. No
uncalcified nodules or masses. No suspicious infiltrates.
Cardiomegaly. Stable AICD device. No other acute abnormalities.
IMPRESSION: No active disease.

## 2019-03-07 IMAGING — CT CT L SPINE W/O CM
3 of 4 series · 12 of 33 positions shown, 14 images · non-contrast
Comparison: 12/09/2017 abdominal ultrasound

CLINICAL DATA: 77 y/o  M; right anterior hip pain and claudication.

EXAM:
CT LUMBAR SPINE WITHOUT CONTRAST
TECHNIQUE: Multidetector CT imaging of the lumbar spine was performed without
intravenous contrast administration. Multiplanar CT image
reconstructions were also generated.

[Series 5: sagittal bone · sagittal · 0.34mm/px · 5 of 73 slices shown, 6 images]
[im 25/73  bone]
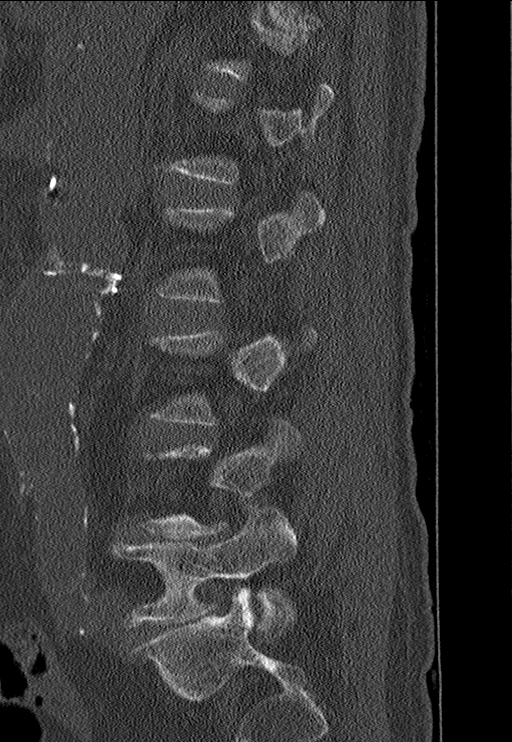
[im 31/73  bone]
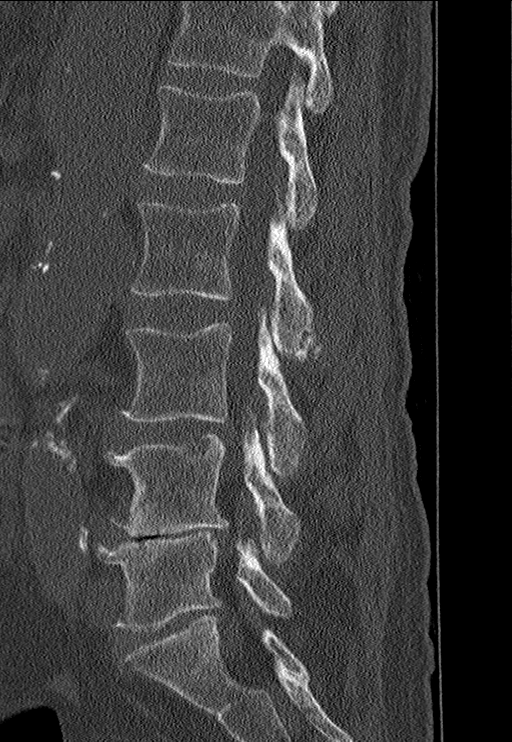
[im 37/73  soft-tissue]
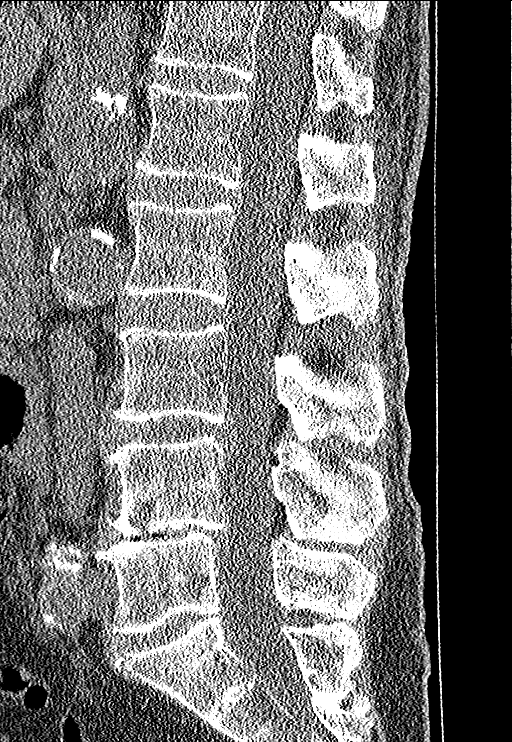
[im 37/73  bone]
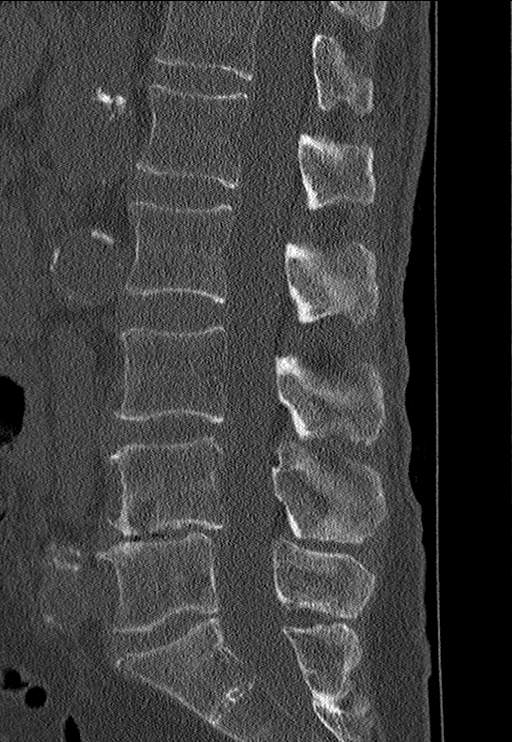
[im 43/73  bone]
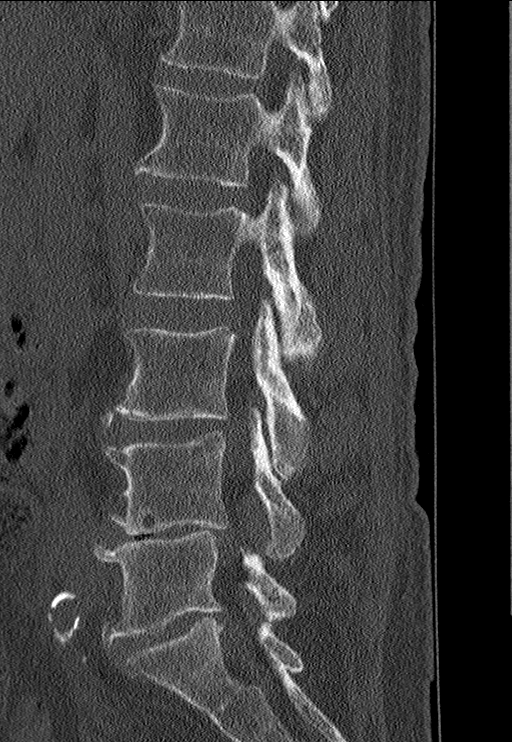
[im 49/73  bone]
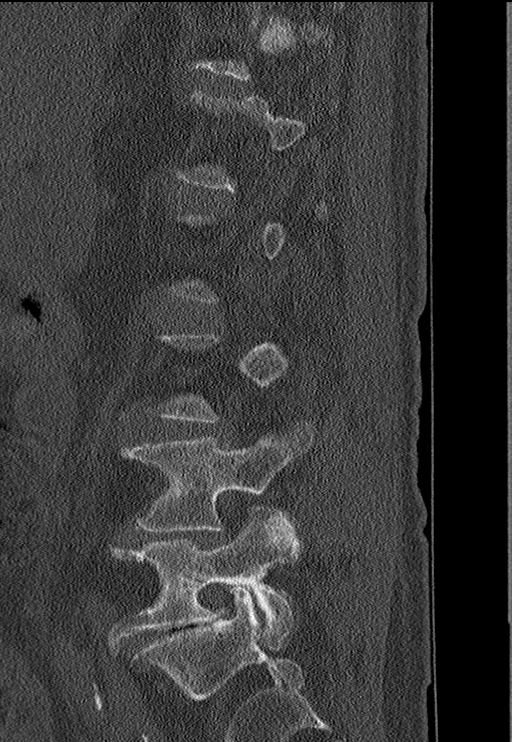

[Series 6: coronal bone · coronal · 0.33mm/px · 3 of 52 slices shown]
[im 11/52  bone]
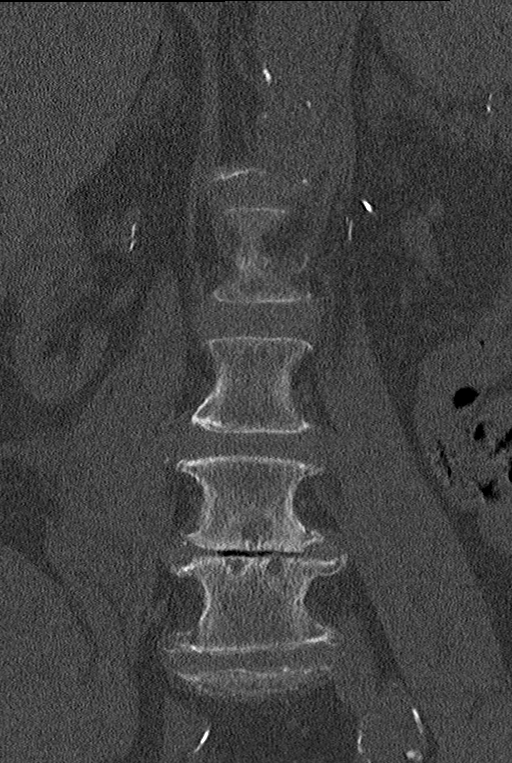
[im 21/52  bone]
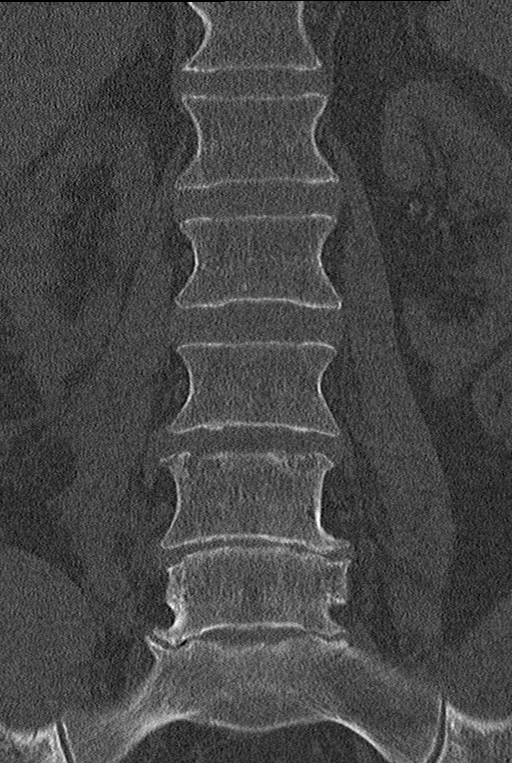
[im 31/52  bone]
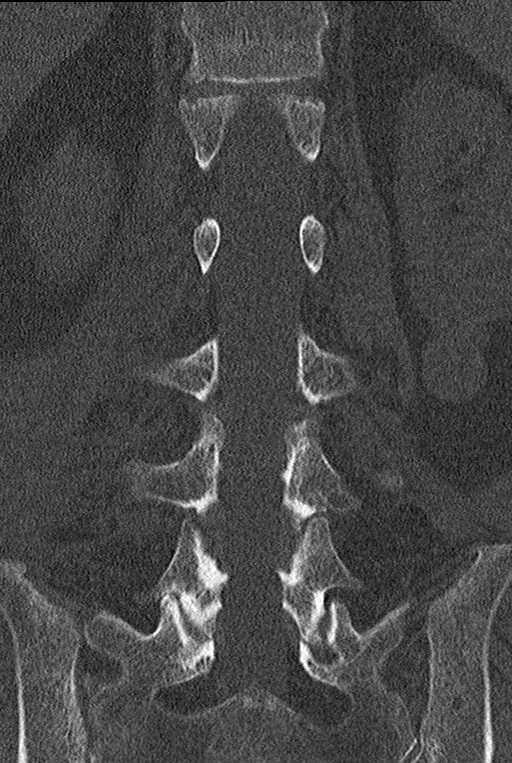

[Series 9: multi disc · axial · 0.21mm/px · z∈[-576,-406]mm · 4 of 112 slices shown, 5 images]
[im 19/112  soft-tissue]
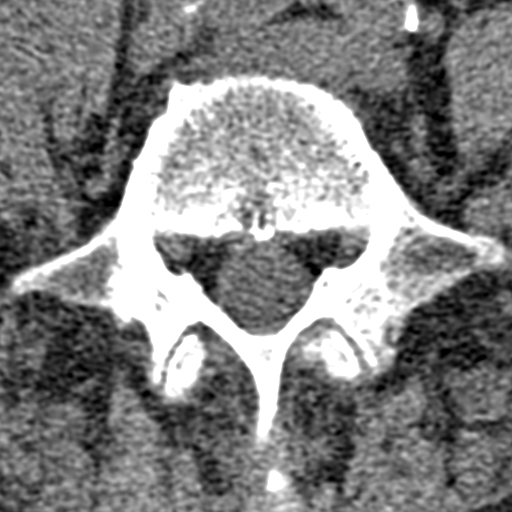
[im 19/112  bone]
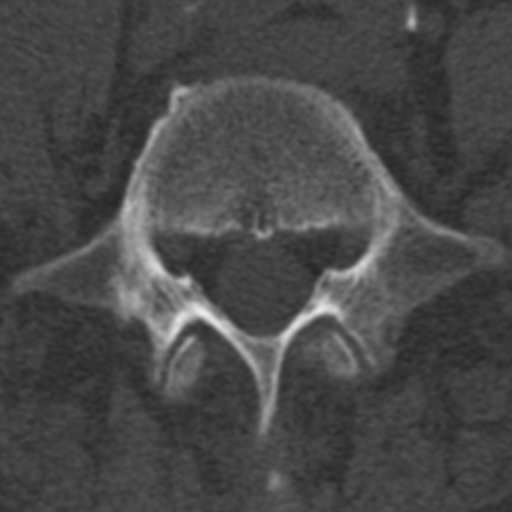
[im 38/112  bone]
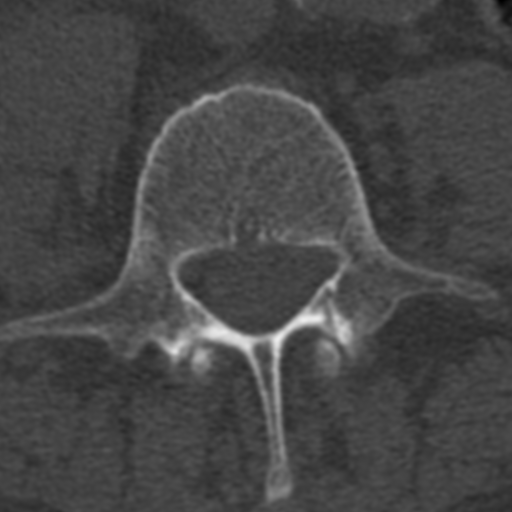
[im 75/112  bone]
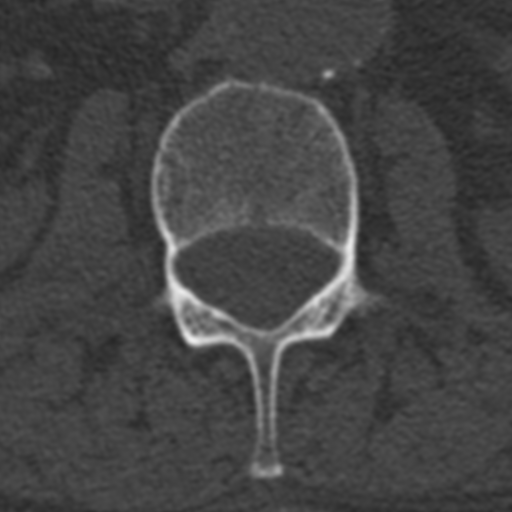
[im 93/112  bone]
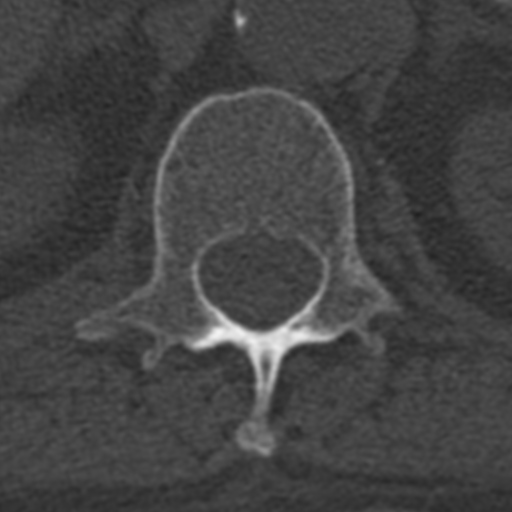

[12 of 33 positions shown; findings below may reference images not displayed]

FINDINGS: Segmentation: 5 lumbar type vertebrae.

Alignment: Straightening of lumbar lordosis. L4-5 grade 1
retrolisthesis.

Vertebrae: No acute fracture or focal pathologic process.

Paraspinal and other soft tissues: Multiple simple and hemorrhagic
cysts of the kidneys bilaterally. 26 mm indeterminate lesion of the
lower pole of left kidney measuring 38 HU. Small right pleural
effusion. Abdominal aortic calcific atherosclerosis. Abdominal
aortic aneurysm measuring up to 3.6 cm. Edema within the right
retroperitoneum along the course of iliopsoas muscles, partially
visualized.

Disc levels: Mild L3-4, moderate L4-5, mild L5-S1 loss of
intervertebral disc space height and discogenic degenerative
changes. Disc and facet degenerative changes result in mild
foraminal stenosis bilaterally at the L4-5 and L5-S1 levels. No
high-grade canal stenosis.
IMPRESSION: 1. No acute fracture or dislocation of the lumbar spine.
2. Edema within the right retroperitoneum along the course of
iliopsoas muscles, partially visualized. This may represent
retroperitoneal hematoma, trauma, or myositis.
3. Abdominal aortic aneurysm measuring up to 3.6 cm. Recommend
followup by ultrasound in 2 years. This recommendation follows ACR
consensus guidelines: White Paper of the ACR Incidental Findings
Committee II on Vascular Findings. [HOSPITAL] [id number];
[DATE].
4. Small right pleural effusion.
5. 26 mm indeterminate lesion of lower pole of left kidney measuring
38 HU. Further evaluation with renal protocol CT or MRI is
recommended.
